# Patient Record
Sex: Female | Born: 1990 | Race: White | Hispanic: No | Smoking: Never smoker
Health system: Southern US, Community
[De-identification: ages and names within clinical notes are randomized; demographics above are authoritative.]

## PROBLEM LIST (undated history)

## (undated) DIAGNOSIS — G4733 Obstructive sleep apnea (adult) (pediatric): Secondary | ICD-10-CM

## (undated) DIAGNOSIS — F32A Depression, unspecified: Secondary | ICD-10-CM

## (undated) DIAGNOSIS — F3181 Bipolar II disorder: Secondary | ICD-10-CM

## (undated) DIAGNOSIS — Z1379 Encounter for other screening for genetic and chromosomal anomalies: Secondary | ICD-10-CM

## (undated) DIAGNOSIS — F419 Anxiety disorder, unspecified: Secondary | ICD-10-CM

## (undated) DIAGNOSIS — F39 Unspecified mood [affective] disorder: Secondary | ICD-10-CM

## (undated) DIAGNOSIS — Z8489 Family history of other specified conditions: Secondary | ICD-10-CM

## (undated) DIAGNOSIS — G43909 Migraine, unspecified, not intractable, without status migrainosus: Secondary | ICD-10-CM

## (undated) DIAGNOSIS — Z803 Family history of malignant neoplasm of breast: Secondary | ICD-10-CM

## (undated) DIAGNOSIS — M419 Scoliosis, unspecified: Secondary | ICD-10-CM

## (undated) HISTORY — PX: LAPAROSCOPIC OOPHERECTOMY: SHX6507

## (undated) HISTORY — DX: Obstructive sleep apnea (adult) (pediatric): G47.33

## (undated) HISTORY — DX: Unspecified mood (affective) disorder: F39

## (undated) HISTORY — DX: Anxiety disorder, unspecified: F41.9

## (undated) HISTORY — DX: Encounter for other screening for genetic and chromosomal anomalies: Z13.79

## (undated) HISTORY — PX: WISDOM TOOTH EXTRACTION: SHX21

## (undated) HISTORY — DX: Migraine, unspecified, not intractable, without status migrainosus: G43.909

## (undated) HISTORY — DX: Family history of malignant neoplasm of breast: Z80.3

---

## 2010-05-25 HISTORY — PX: LAPAROSCOPIC OOPHERECTOMY: SHX6507

## 2012-01-13 ENCOUNTER — Ambulatory Visit: Payer: Self-pay | Admitting: Family Medicine

## 2012-01-13 VITALS — BP 110/72 | HR 80 | Temp 98.5°F | Resp 18 | Ht 69.5 in | Wt 133.0 lb

## 2012-01-13 DIAGNOSIS — L03119 Cellulitis of unspecified part of limb: Secondary | ICD-10-CM

## 2012-01-13 DIAGNOSIS — IMO0002 Reserved for concepts with insufficient information to code with codable children: Secondary | ICD-10-CM

## 2012-01-13 MED ORDER — DOXYCYCLINE HYCLATE 100 MG PO TABS: 100.0000 mg | ORAL_TABLET | Freq: Two times a day (BID) | ORAL | Status: AC

## 2012-01-13 NOTE — Progress Notes (Signed)
[facility] [address], [place] [address] [phone]  Date:  01/13/2012   Name:  Katelyn King   DOB:  03/20/1991   MRN:  [id number]  PCP:  No primary provider on file.    Chief Complaint: infection on left arm   History of Present Illness:  Katelyn King is a 21 y.o. very pleasant female patient who presents with the following:  She notes an area of infection on her left arm.  She ?got a splinter in the area when she was in [place] about 6 weeks ago. She is actually not sure if she had a splinter, but she did seem to have some sort of injury to the skin that got better. The area was ok, but then it started to be red and sore a few days ago. It is now painful and has a lump on the skin.  No fever, no aches.   She is generally healthy.  She is on OCP.  Recently moved to this area with her SO.    There is no problem list on file for this patient.   No past medical history on file.  No past surgical history on file.  History  Substance Use Topics  . Smoking status: Never Smoker   . Smokeless tobacco: Not on file  . Alcohol Use: Not on file    No family history on file.  No Known Allergies  Medication list has been reviewed and updated.  Current Outpatient Prescriptions on File Prior to Visit  Medication Sig Dispense Refill  . drospirenone-ethinyl estradiol (YASMIN,ZARAH,SYEDA) 3-0.03 MG tablet Take 1 tablet by mouth daily.      Marland Kitchen lamoTRIgine (LAMICTAL) 150 MG tablet Take 150 mg by mouth daily.        Review of Systems:  As per HPI- otherwise negative.   Physical Examination: Filed Vitals:   01/13/12 1250  BP: 110/72  Pulse: 80  Temp: 98.5 F (36.9 C)  Resp: 18   Filed Vitals:   01/13/12 1250  Height: 5' 9.5" (1.765 m)  Weight: 133 lb (60.328 kg)   Body mass index is 19.36 kg/(m^2). Ideal Body Weight: Weight in (lb) to have BMI = 25: 171.4    GEN: WDWN, NAD, Non-toxic, Alert & Oriented x 3 HEENT: Atraumatic, Normocephalic.    Ears and Nose: No external deformity. EXTR: No clubbing/cyanosis/edema NEURO: Normal gait.  PSYCH: Normally interactive. Conversant. Not depressed or anxious appearing.  Calm demeanor.  Left ventral forearm: there is a tender, swollen area consistent with a superficial infection.  ? Tip of a splinter visible under skin.  Area cleansed, then nicked top of skin and tried to removed FB with splinter forceps- only a tiny piece of foreign matter removed, no large splinter.    Assessment and Plan: 1. Cellulitis of arm  doxycycline (VIBRA-TABS) 100 MG tablet   Cellulitis of arm, which may or may not be related to a retained splinter.  Hot compresses, doxycycline (watch for OCP interaction).  Call or RTC if not better in 2 days- Sooner if worse.     Abbe Amsterdam, MD

## 2013-06-08 ENCOUNTER — Other Ambulatory Visit (HOSPITAL_COMMUNITY)
Admission: RE | Admit: 2013-06-08 | Discharge: 2013-06-08 | Disposition: A | Discharge status: Home / Self Care | Payer: Self-pay | Source: Ambulatory Visit | Attending: Obstetrics & Gynecology | Admitting: Obstetrics & Gynecology

## 2013-06-08 DIAGNOSIS — Z01419 Encounter for gynecological examination (general) (routine) without abnormal findings: Secondary | ICD-10-CM | POA: Insufficient documentation

## 2013-11-07 ENCOUNTER — Emergency Department (HOSPITAL_COMMUNITY)
Admission: EM | Admit: 2013-11-07 | Discharge: 2013-11-07 | Disposition: A | Discharge status: Home / Self Care | Payer: Self-pay | Attending: Emergency Medicine | Admitting: Emergency Medicine

## 2013-11-07 ENCOUNTER — Encounter (HOSPITAL_COMMUNITY): Payer: Self-pay | Admitting: Emergency Medicine

## 2013-11-07 DIAGNOSIS — Y939 Activity, unspecified: Secondary | ICD-10-CM | POA: Insufficient documentation

## 2013-11-07 DIAGNOSIS — S61209A Unspecified open wound of unspecified finger without damage to nail, initial encounter: Secondary | ICD-10-CM | POA: Insufficient documentation

## 2013-11-07 DIAGNOSIS — S61219A Laceration without foreign body of unspecified finger without damage to nail, initial encounter: Secondary | ICD-10-CM

## 2013-11-07 DIAGNOSIS — W268XXA Contact with other sharp object(s), not elsewhere classified, initial encounter: Secondary | ICD-10-CM | POA: Insufficient documentation

## 2013-11-07 DIAGNOSIS — Z79899 Other long term (current) drug therapy: Secondary | ICD-10-CM | POA: Insufficient documentation

## 2013-11-07 DIAGNOSIS — Y929 Unspecified place or not applicable: Secondary | ICD-10-CM | POA: Insufficient documentation

## 2013-11-07 MED ORDER — IBUPROFEN 800 MG PO TABS: 800.0000 mg | ORAL_TABLET | Freq: Three times a day (TID) | ORAL | Status: DC

## 2013-11-07 MED ORDER — IBUPROFEN 800 MG PO TABS
800.0000 mg | ORAL_TABLET | Freq: Once | ORAL | Status: AC
  Administered 2013-11-07: 800 mg via ORAL
  Filled 2013-11-07: qty 1

## 2013-11-07 NOTE — ED Notes (Signed)
Pt arrived to the D with a complaint of a laceration to the right middle finger.  Pt cut her finger on a scissor.  Laceration is 2cm longs located on the first joint of the right middle finger

## 2013-11-07 NOTE — ED Provider Notes (Signed)
CSN: [id number]     Arrival date & time 11/07/13  0100 History   First MD Initiated Contact with Patient 11/07/13 0425     Chief Complaint  Patient presents with  . Extremity Laceration     (Consider location/radiation/quality/duration/timing/severity/associated sxs/prior Treatment) Patient is a 23 y.o. female presenting with skin laceration. The history is provided by the patient.  Laceration Location:  Finger Finger laceration location:  R middle finger Depth:  Cutaneous Quality comment:  U shaped Bleeding: controlled   Injury mechanism: scissors. Pain details:    Quality:  Aching   Severity:  Severe   Timing:  Constant   Progression:  Unchanged Foreign body present:  No foreign bodies Relieved by:  Nothing Worsened by:  Nothing tried Ineffective treatments:  None tried Tetanus status:  Up to date   History reviewed. No pertinent past medical history. History reviewed. No pertinent past surgical history. History reviewed. No pertinent family history. History  Substance Use Topics  . Smoking status: Never Smoker   . Smokeless tobacco: Not on file  . Alcohol Use: Yes   OB History   Grav Para Term Preterm Abortions TAB SAB Ect Mult Living                 Review of Systems  Skin: Negative for wound.  All other systems reviewed and are negative.     Allergies  Review of patient's allergies indicates no known allergies.  Home Medications   Prior to Admission medications   Medication Sig Start Date End Date Taking? Authorizing Provider  drospirenone-ethinyl estradiol (YASMIN,ZARAH,SYEDA) 3-0.03 MG tablet Take 1 tablet by mouth daily.    Historical Provider, MD  lamoTRIgine (LAMICTAL) 150 MG tablet Take 150 mg by mouth daily.    Historical Provider, MD   BP 121/70  Pulse 94  Temp(Src) 98.3 F (36.8 C) (Oral)  Resp 18  SpO2 100%  LMP 10/31/2013 Physical Exam  Constitutional: She is oriented to person, place, and time. She appears well-developed and  well-nourished. No distress.  HENT:  Head: Normocephalic and atraumatic.  Mouth/Throat: Oropharynx is clear and moist.  Eyes: Conjunctivae are normal. Pupils are equal, round, and reactive to light.  Neck: Normal range of motion. Neck supple.  Cardiovascular: Normal rate, regular rhythm and intact distal pulses.   Pulmonary/Chest: Effort normal and breath sounds normal. She has no wheezes. She has no rales.  Abdominal: Soft. Bowel sounds are normal.  Musculoskeletal: Normal range of motion.  Neurological: She is alert and oriented to person, place, and time.  Skin: Skin is warm and dry.  Psychiatric: She has a normal mood and affect.    ED Course  Procedures (including critical care time) Labs Review Labs Reviewed - No data to display  Imaging Review No results found.   EKG Interpretation None      MDM   Final diagnoses:  None    LACERATION REPAIR Performed by: Carlisle Beers Authorized by: Carlisle Beers Consent: Verbal consent obtained. Risks and benefits: risks, benefits and alternatives were discussed Consent given by: patient Patient identity confirmed: provided demographic data Prepped and Draped in normal sterile fashion Wound explored  Laceration Location: right dorsal distal middle finger  Laceration Length: 1 cm  No Foreign Bodies seen or palpated  Irrigation method: syringe Amount of cleaning: standard  Skin closure: dermabond  Technique: dermabond Patient tolerance: Patient tolerated the procedure well with no immediate complications.     Carlisle Beers, MD 11/07/13 [phone]

## 2015-05-28 ENCOUNTER — Other Ambulatory Visit: Payer: Self-pay | Admitting: Family Medicine

## 2015-05-28 ENCOUNTER — Ambulatory Visit
Admission: RE | Admit: 2015-05-28 | Discharge: 2015-05-28 | Disposition: A | Discharge status: Home / Self Care | Payer: No Typology Code available for payment source | Source: Ambulatory Visit | Attending: Family Medicine | Admitting: Family Medicine

## 2015-05-28 DIAGNOSIS — R519 Headache, unspecified: Secondary | ICD-10-CM

## 2015-05-28 DIAGNOSIS — R51 Headache: Principal | ICD-10-CM

## 2015-06-11 ENCOUNTER — Ambulatory Visit (INDEPENDENT_AMBULATORY_CARE_PROVIDER_SITE_OTHER): Payer: Self-pay | Admitting: Neurology

## 2015-06-11 ENCOUNTER — Encounter: Payer: Self-pay | Admitting: Neurology

## 2015-06-11 VITALS — BP 105/69 | HR 82 | Ht 70.0 in | Wt 150.0 lb

## 2015-06-11 DIAGNOSIS — G43109 Migraine with aura, not intractable, without status migrainosus: Secondary | ICD-10-CM

## 2015-06-11 DIAGNOSIS — G43909 Migraine, unspecified, not intractable, without status migrainosus: Secondary | ICD-10-CM | POA: Insufficient documentation

## 2015-06-11 MED ORDER — RIZATRIPTAN BENZOATE 5 MG PO TBDP: 5.0000 mg | ORAL_TABLET | ORAL | Status: DC | PRN

## 2015-06-11 MED ORDER — PROPRANOLOL HCL ER 60 MG PO CP24: 60.0000 mg | ORAL_CAPSULE | Freq: Every day | ORAL | Status: DC

## 2015-06-11 NOTE — Patient Instructions (Signed)
Magnesium oxide 400 mg twice a day Riboflavin  100 mg twice a day 

## 2015-06-11 NOTE — Progress Notes (Signed)
PATIENT: Katelyn King DOB: [birth year]  Chief Complaint  Patient presents with  . Frequent headaches    She is here with her husband, Will.  Reports having at least 8 headache days per month, in varying degrees of severity. The headaches are sometimes associated with vision changes, nausea and dizziness.  She has never taken a prophylactic medication.  She has never tried any triptans.  Fiorcet and OTC NSAIDS are no longer helpful.     HISTORICAL  Marcenia Meltzer is a 25 years old right-handed female, accompanied by her husband, seen in refer by her primary care physician Dr. Antony Contras for evaluation of chronic migraine  She had a history of bipolar type II, is taking lamotrigine 200 mg every night, Latuda 20 mg every night,  She reported a history of migraine since elementary school, her typical migraine are lateralized severe pounding headache was associated light noise sensitivity, lasting 6-8 hours, she brought headache diary at today's visit, she has average 6 to 8 typical migraines each month, moderate to severe, she has been taking over-the-counter Tylenol, sometimes with caffeinated drink to help her headache, occasionally her migraines preceded by visual distortion.  Trigger for her headache a sleep deprivation, weather change, stress,  Laboratory in 2016, normal glucose 79, LDL 72, normal CBC, hemoglobin 13.8, normal ferritin 70 7.9, TSH 3.79  I have personally reviewed CAT scan of the brain without contrast January 3rd 2016, that was within normal limit   REVIEW OF SYSTEMS: Full 14 system review of systems performed and notable only for headaches, depression, anxiety, running nose  ALLERGIES: No Known Allergies  HOME MEDICATIONS: Current Outpatient Prescriptions  Medication Sig Dispense Refill  . drospirenone-ethinyl estradiol (YASMIN,ZARAH,SYEDA) 3-0.03 MG tablet Take 1 tablet by mouth daily.    Marland Kitchen lamoTRIgine (LAMICTAL) 150 MG tablet Take 150 mg by mouth daily.    Marland Kitchen  lurasidone (LATUDA) 20 MG TABS tablet Take 20 mg by mouth daily.     No current facility-administered medications for this visit.    PAST MEDICAL HISTORY: Past Medical History  Diagnosis Date  . Mood disorder ([place])   . Migraine   . Anxiety     PAST SURGICAL HISTORY: Past Surgical History  Procedure Laterality Date  . Laparoscopic oopherectomy      FAMILY HISTORY: Family History  Problem Relation Age of Onset  . Breast cancer Mother   . Breast cancer Paternal Grandmother   . Hypertension Paternal Grandfather   . Hypercholesterolemia Maternal Grandmother   . Hypercholesterolemia Paternal Grandmother   . Heart disease Maternal Grandfather   . Healthy Father     SOCIAL HISTORY:  Social History   Social History  . Marital Status: Married    Spouse Name: N/A  . Number of Children: 0  . Years of Education: N/A   Occupational History  . Student     Investment banker, corporate   Social History Main Topics  . Smoking status: Never Smoker   . Smokeless tobacco: Not on file  . Alcohol Use: No  . Drug Use: No  . Sexual Activity: Yes   Other Topics Concern  . Not on file   Social History Narrative   Lives at home with husband.   Right-handed.   No caffeine use.     PHYSICAL EXAM   Filed Vitals:   06/11/15 1536  BP: 105/69  Pulse: 82  Height: 5\' 10"  (1.778 m)  Weight: 150 lb (68.04 kg)    Not recorded  Body mass index is 21.52 kg/(m^2).  PHYSICAL EXAMNIATION:  Gen: NAD, conversant, well nourised, obese, well groomed                     Cardiovascular: Regular rate rhythm, no peripheral edema, warm, nontender. Eyes: Conjunctivae clear without exudates or hemorrhage Neck: Supple, no carotid bruise. Pulmonary: Clear to auscultation bilaterally   NEUROLOGICAL EXAM:  MENTAL STATUS: Speech:    Speech is normal; fluent and spontaneous with normal comprehension.  Cognition:     Orientation to time, place and person     Normal recent and remote  memory     Normal Attention span and concentration     Normal Language, naming, repeating,spontaneous speech     Fund of knowledge   CRANIAL NERVES: CN II: Visual fields are full to confrontation. Fundoscopic exam is normal with sharp discs and no vascular changes. Pupils are round equal and briskly reactive to light. CN III, IV, VI: extraocular movement are normal. No ptosis. CN V: Facial sensation is intact to pinprick in all 3 divisions bilaterally. Corneal responses are intact.  CN VII: Face is symmetric with normal eye closure and smile. CN VIII: Hearing is normal to rubbing fingers CN IX, X: Palate elevates symmetrically. Phonation is normal. CN XI: Head turning and shoulder shrug are intact CN XII: Tongue is midline with normal movements and no atrophy.  MOTOR: There is no pronator drift of out-stretched arms. Muscle bulk and tone are normal. Muscle strength is normal.  REFLEXES: Reflexes are 2+ and symmetric at the biceps, triceps, knees, and ankles. Plantar responses are flexor.  SENSORY: Intact to light touch, pinprick, position sense, and vibration sense are intact in fingers and toes.  COORDINATION: Rapid alternating movements and fine finger movements are intact. There is no dysmetria on finger-to-nose and heel-knee-shin.    GAIT/STANCE: Posture is normal. Gait is steady with normal steps, base, arm swing, and turning. Heel and toe walking are normal. Tandem gait is normal.  Romberg is absent.   DIAGNOSTIC DATA (LABS, IMAGING, TESTING) - I reviewed patient records, labs, notes, testing and imaging myself where available.   ASSESSMENT AND PLAN  Tagen Grzesik is a 25 y.o. female   Chronic migraines  Start preventive medications Inderal XL 60 mg daily  Maxalt as needed   Marcial Pacas, M.D. Ph.D.  [facility] [address] [place], [place] [address] Ph: [phone] Fax: [phone]  CC: Antony Contras, MD

## 2015-08-15 ENCOUNTER — Ambulatory Visit (INDEPENDENT_AMBULATORY_CARE_PROVIDER_SITE_OTHER): Payer: Self-pay | Admitting: Nurse Practitioner

## 2015-08-15 ENCOUNTER — Encounter: Payer: Self-pay | Admitting: Nurse Practitioner

## 2015-08-15 VITALS — BP 99/68 | HR 73 | Ht 70.0 in | Wt 148.8 lb

## 2015-08-15 DIAGNOSIS — G43109 Migraine with aura, not intractable, without status migrainosus: Secondary | ICD-10-CM

## 2015-08-15 NOTE — Progress Notes (Signed)
[facility]  PATIENT: Katelyn King DOB: 1990/11/30   REASON FOR VISIT: Follow-up for migraine HISTORY FROM: Patient    HISTORY OF PRESENT ILLNESS: Ms. Daigneau, 25 year old female returns for follow-up. She was initially evaluated for migraine by Dr. Army Melia 06/11/2015. She was placed on Inderal LA 60 mg at that time. She has kept a record of her headaches. She has had 3 headaches that were 5 out of 10 on the pain scale and  3 that were 3 out of 10 on a pain scale. She has not had a headache since 07/19/15 and she states the "medication has changed my life ". She also has a history of bipolar disorder. She takes melatonin for  insomnia with relief. She returns for reevaluation  HISTORY: Devonya Nees is a 25 years old right-handed female, accompanied by her husband, seen in refer by her primary care physician Dr. Antony Contras for evaluation of chronic migraine  She had a history of bipolar type II, is taking lamotrigine 200 mg every night, Latuda 20 mg every night, She reported a history of migraine since elementary school, her typical migraine are lateralized severe pounding headache was associated light noise sensitivity, lasting 6-8 hours, she brought headache diary at today's visit, she has average 6 to 8 typical migraines each month, moderate to severe, she has been taking over-the-counter Tylenol, sometimes with caffeinated drink to help her headache, occasionally her migraines preceded by visual distortion. Trigger for her headache a sleep deprivation, weather change, stress, Laboratory in 2016, normal glucose 79, LDL 72, normal CBC, hemoglobin 13.8, normal ferritin 70 7.9, TSH 3.79 I have personally reviewed CAT scan of the brain without contrast January 3rd 2016, that was within normal limit    REVIEW OF SYSTEMS: Full 14 system review of systems performed and notable only for those listed, all others are neg:  Constitutional: neg  Cardiovascular: neg Ear/Nose/Throat:  neg  Skin: neg Eyes: neg Respiratory: neg Gastroitestinal: neg  Hematology/Lymphatic: neg  Endocrine: neg Musculoskeletal:neg Allergy/Immunology: neg Neurological: neg Psychiatric: Depression treated by psychiatry Sleep : neg   ALLERGIES: No Known Allergies  HOME MEDICATIONS: Outpatient Prescriptions Prior to Visit  Medication Sig Dispense Refill  . drospirenone-ethinyl estradiol (YASMIN,ZARAH,SYEDA) 3-0.03 MG tablet Take 1 tablet by mouth daily.    . propranolol ER (INDERAL LA) 60 MG 24 hr capsule Take 1 capsule (60 mg total) by mouth daily. 30 capsule 6  . rizatriptan (MAXALT-MLT) 5 MG disintegrating tablet Take 1 tablet (5 mg total) by mouth as needed. May repeat in 2 hours if needed 15 tablet 6  . lamoTRIgine (LAMICTAL) 150 MG tablet Take 150 mg by mouth daily.    Marland Kitchen lurasidone (LATUDA) 20 MG TABS tablet Take 20 mg by mouth daily.     No facility-administered medications prior to visit.    PAST MEDICAL HISTORY: Past Medical History  Diagnosis Date  . Mood disorder ([place])   . Migraine   . Anxiety     PAST SURGICAL HISTORY: Past Surgical History  Procedure Laterality Date  . Laparoscopic oopherectomy      FAMILY HISTORY: Family History  Problem Relation Age of Onset  . Breast cancer Mother   . Breast cancer Paternal Grandmother   . Hypertension Paternal Grandfather   . Hypercholesterolemia Maternal Grandmother   . Hypercholesterolemia Paternal Grandmother   . Heart disease Maternal Grandfather   . Healthy Father     SOCIAL HISTORY: Social History   Social History  . Marital Status: Married  Spouse Name: N/A  . Number of Children: 0  . Years of Education: N/A   Occupational History  . Student     Investment banker, corporate   Social History Main Topics  . Smoking status: Never Smoker   . Smokeless tobacco: Never Used  . Alcohol Use: No  . Drug Use: No  . Sexual Activity:    Partners: Male   Other Topics Concern  . Not on file   Social History  Narrative   Lives at home with husband.   Right-handed.   No caffeine use.     PHYSICAL EXAM  Filed Vitals:   08/15/15 1634  BP: 99/68  Pulse: 73  Height: 5\' 10"  (1.778 m)  Weight: 148 lb 12.8 oz (67.495 kg)   Body mass index is 21.35 kg/(m^2).  Generalized: Well developed, in no acute distress  Head: normocephalic and atraumatic,. Oropharynx benign  Neck: Supple, no carotid bruits  Cardiac: Regular rate rhythm, no murmur  Musculoskeletal: No deformity   Neurological examination   Mentation: Alert oriented to time, place, history taking. Attention span and concentration appropriate. Recent and remote memory intact.  Follows all commands speech and language fluent.   Cranial nerve II-XII: Pupils were equal round reactive to light extraocular movements were full, visual field were full on confrontational test. Facial sensation and strength were normal. hearing was intact to finger rubbing bilaterally. Uvula tongue midline. head turning and shoulder shrug were normal and symmetric.Tongue protrusion into cheek strength was normal. Motor: normal bulk and tone, full strength in the BUE, BLE, fine finger movements normal, no pronator drift. No focal weakness Sensory: normal and symmetric to light touch, pinprick, and  Vibration, proprioception  Coordination: finger-nose-finger, heel-to-shin bilaterally, no dysmetria Reflexes: Brachioradialis 2/2, biceps 2/2, triceps 2/2, patellar 2/2, Achilles 2/2, plantar responses were flexor bilaterally. Gait and Station: Rising up from seated position without assistance, normal stance,  moderate stride, good arm swing, smooth turning, able to perform tiptoe, and heel walking without difficulty. Tandem gait is steady  DIAGNOSTIC DATA (LABS, IMAGING, TESTING) -ASSESSMENT AND PLAN  25 y.o. year old female  has a past medical history of Mood disorder ([place]); Migraine; and Anxiety. here to follow-up for migraines. Her migraines are in excellent control  since being placed on Inderal LA 60 mg daily. She has not had a migraine in over a month  Continue Inderal at 60mg  daily Continue Maxalt acutely  F/U in 6 months  Dennie Bible, [facility], [facility], [facility] [address] [place], [place] [address] [phone]

## 2015-08-15 NOTE — Patient Instructions (Signed)
Continue Inderal at 60mg  daily Continue Maxalt acutely  F/U in 6 months

## 2015-08-16 NOTE — Progress Notes (Signed)
I have reviewed and agreed above plan. 

## 2015-12-29 ENCOUNTER — Other Ambulatory Visit: Payer: Self-pay | Admitting: Neurology

## 2016-02-14 ENCOUNTER — Encounter: Payer: Self-pay | Admitting: Nurse Practitioner

## 2016-02-14 ENCOUNTER — Ambulatory Visit (INDEPENDENT_AMBULATORY_CARE_PROVIDER_SITE_OTHER): Payer: Self-pay | Admitting: Nurse Practitioner

## 2016-02-14 VITALS — BP 100/68 | HR 72 | Ht 70.0 in | Wt 143.8 lb

## 2016-02-14 DIAGNOSIS — G43109 Migraine with aura, not intractable, without status migrainosus: Secondary | ICD-10-CM

## 2016-02-14 MED ORDER — PROPRANOLOL HCL ER 60 MG PO CP24: ORAL_CAPSULE | ORAL | 11 refills | Status: DC

## 2016-02-14 MED ORDER — RIZATRIPTAN BENZOATE 5 MG PO TBDP
5.0000 mg | ORAL_TABLET | ORAL | 6 refills | Status: DC | PRN
Start: 2016-02-14 — End: 2017-03-04

## 2016-02-14 NOTE — Progress Notes (Signed)
[facility]  PATIENT: Katelyn King DOB: 27-May-1990   REASON FOR VISIT: Follow-up for migraine HISTORY FROM: Patient    HISTORY OF PRESENT ILLNESS: Katelyn King, 25 year old female returns for follow-up. She was initially evaluated for migraine by Dr. Krista Blue 06/11/2015. She was placed on Inderal LA 60 mg at that time. She has not kept a record of her headaches because they are less but she has about 2 headaches per usually around her menstrual cycle. She states the "medication has changed my life ". She also has a history of bipolar disorder. She takes melatonin for  insomnia with relief.  She fell in her bathroom back in January 2017 and hit her head. CT of the brain was normal.She returns for reevaluation  HISTORY: Katelyn King is a 25 years old right-handed female, accompanied by her husband, seen in refer by her primary care physician Dr. Antony Contras for evaluation of chronic migraine  She had a history of bipolar type II, is taking lamotrigine 200 mg every night, Latuda 20 mg every night, She reported a history of migraine since elementary school, her typical migraine are lateralized severe pounding headache was associated light noise sensitivity, lasting 6-8 hours, she brought headache diary at today's visit, she has average 6 to 8 typical migraines each month, moderate to severe, she has been taking over-the-counter Tylenol, sometimes with caffeinated drink to help her headache, occasionally her migraines preceded by visual distortion. Trigger for her headache a sleep deprivation, weather change, stress, Laboratory in 2016, normal glucose 79, LDL 72, normal CBC, hemoglobin 13.8, normal ferritin 70 7.9, TSH 3.79 I have personally reviewed CAT scan of the brain without contrast January 3rd 2016, that was within normal limit    REVIEW OF SYSTEMS: Full 14 system review of systems performed and notable only for those listed, all others are neg:  Constitutional: neg    Cardiovascular: neg Ear/Nose/Throat: neg  Skin: neg Eyes: neg Respiratory: neg Gastroitestinal: neg  Hematology/Lymphatic: neg  Endocrine: neg Musculoskeletal:neg Allergy/Immunology: neg Neurological: neg Psychiatric: Depression treated by psychiatry Sleep : neg   ALLERGIES: No Known Allergies  HOME MEDICATIONS: Outpatient Medications Prior to Visit  Medication Sig Dispense Refill  . drospirenone-ethinyl estradiol (YASMIN,ZARAH,SYEDA) 3-0.03 MG tablet Take 1 tablet by mouth daily.    Marland Kitchen lamoTRIgine (LAMICTAL) 200 MG tablet Take 200 mg by mouth daily.    . Lurasidone HCl (LATUDA) 60 MG TABS Take 30 mg by mouth daily.    Marland Kitchen MELATONIN PO Take 3 mg by mouth daily.    . propranolol ER (INDERAL LA) 60 MG 24 hr capsule TAKE 1 CAPSULE(60 MG) BY MOUTH DAILY 30 capsule 11  . rizatriptan (MAXALT-MLT) 5 MG disintegrating tablet Take 1 tablet (5 mg total) by mouth as needed. May repeat in 2 hours if needed 15 tablet 6   No facility-administered medications prior to visit.     PAST MEDICAL HISTORY: Past Medical History:  Diagnosis Date  . Anxiety   . Migraine   . Mood disorder ([place])     PAST SURGICAL HISTORY: Past Surgical History:  Procedure Laterality Date  . LAPAROSCOPIC OOPHERECTOMY      FAMILY HISTORY: Family History  Problem Relation Age of Onset  . Breast cancer Mother   . Healthy Father   . Breast cancer Paternal Grandmother   . Hypercholesterolemia Paternal Grandmother   . Hypertension Paternal Grandfather   . Hypercholesterolemia Maternal Grandmother   . Heart disease Maternal Grandfather     SOCIAL HISTORY: Social History  Social History  . Marital status: Married    Spouse name: N/A  . Number of children: 0  . Years of education: N/A   Occupational History  . Student     Investment banker, corporate   Social History Main Topics  . Smoking status: Never Smoker  . Smokeless tobacco: Never Used  . Alcohol use No  . Drug use: No  . Sexual activity: Yes     Partners: Male   Other Topics Concern  . Not on file   Social History Narrative   Lives at home with husband.   Right-handed.   No caffeine use.     PHYSICAL EXAM  Vitals:   02/14/16 1028  BP: 100/68  Pulse: 72  Weight: 143 lb 12.8 oz (65.2 kg)  Height: 5\' 10"  (1.778 m)   Body mass index is 20.63 kg/m.  Generalized: Well developed, in no acute distress  Head: normocephalic and atraumatic,. Oropharynx benign  Neck: Supple, no carotid bruits  Cardiac: Regular rate rhythm, no murmur  Musculoskeletal: No deformity   Neurological examination   Mentation: Alert oriented to time, place, history taking. Attention span and concentration appropriate. Recent and remote memory intact.  Follows all commands speech and language fluent.   Cranial nerve II-XII: Pupils were equal round reactive to light extraocular movements were full, visual field were full on confrontational test. Facial sensation and strength were normal. hearing was intact to finger rubbing bilaterally. Uvula tongue midline. head turning and shoulder shrug were normal and symmetric.Tongue protrusion into cheek strength was normal. Motor: normal bulk and tone, full strength in the BUE, BLE, fine finger movements normal, no pronator drift. No focal weakness Sensory: normal and symmetric to light touch, pinprick, and  Vibration, proprioception  Coordination: finger-nose-finger, heel-to-shin bilaterally, no dysmetria Reflexes: Brachioradialis 2/2, biceps 2/2, triceps 2/2, patellar 2/2, Achilles 2/2, plantar responses were flexor bilaterally. Gait and Station: Rising up from seated position without assistance, normal stance,  moderate stride, good arm swing, smooth turning, able to perform tiptoe, and heel walking without difficulty. Tandem gait is steady  DIAGNOSTIC DATA (LABS, IMAGING, TESTING) -ASSESSMENT AND PLAN  25 y.o. year old female  has a past medical history of Anxiety; Migraine; and Mood disorder ([place]). here to  follow-up for migraines. Her migraines are in excellent control since being placed on Inderal LA 60 mg daily. She has 1 to 2 headaches  per month usually around her cycle.  Continue Inderal at 60mg  daily Continue Maxalt acutely  Call for worsening of headaches F/U in 6  To 8 months  Dennie Bible, [facility], [facility], APRN  [facility] [address] [place], [place] [address] [phone]

## 2016-02-14 NOTE — Patient Instructions (Signed)
Continue Inderal at 60mg  daily Continue Maxalt acutely  Call for worsening of headaches Follow-up in 6-8 months

## 2016-02-19 NOTE — Progress Notes (Signed)
I have reviewed and agreed above plan. 

## 2016-05-25 HISTORY — PX: WISDOM TOOTH EXTRACTION: SHX21

## 2016-06-26 ENCOUNTER — Other Ambulatory Visit (HOSPITAL_COMMUNITY)
Admission: RE | Admit: 2016-06-26 | Discharge: 2016-06-26 | Disposition: A | Discharge status: Home / Self Care | Payer: 59 | Source: Ambulatory Visit | Attending: Obstetrics and Gynecology | Admitting: Obstetrics and Gynecology

## 2016-06-26 ENCOUNTER — Other Ambulatory Visit: Payer: Self-pay | Admitting: Obstetrics & Gynecology

## 2016-06-26 DIAGNOSIS — Z01419 Encounter for gynecological examination (general) (routine) without abnormal findings: Secondary | ICD-10-CM | POA: Diagnosis not present

## 2016-06-30 DIAGNOSIS — E559 Vitamin D deficiency, unspecified: Secondary | ICD-10-CM | POA: Diagnosis not present

## 2016-06-30 DIAGNOSIS — R5383 Other fatigue: Secondary | ICD-10-CM | POA: Diagnosis not present

## 2016-06-30 LAB — CYTOLOGY - PAP: Diagnosis: NEGATIVE

## 2016-07-06 ENCOUNTER — Encounter: Payer: Self-pay | Admitting: Physical Therapy

## 2016-07-06 ENCOUNTER — Ambulatory Visit: Payer: 59 | Attending: Obstetrics & Gynecology | Admitting: Physical Therapy

## 2016-07-06 DIAGNOSIS — M62838 Other muscle spasm: Secondary | ICD-10-CM | POA: Insufficient documentation

## 2016-07-06 DIAGNOSIS — M6281 Muscle weakness (generalized): Secondary | ICD-10-CM | POA: Diagnosis present

## 2016-07-06 NOTE — Therapy (Signed)
[facility] [address] [place], [place], [address] Phone: [phone]   Fax:  [phone]  Physical Therapy Evaluation  Patient Details  Name: Katelyn King MRN: [id number] Date of Birth: 1990-06-10 Referring Provider: Dr. Janyth Pupa  Encounter Date: 07/06/2016      PT End of Session - 07/06/16 1027    Visit Number 1   Date for PT Re-Evaluation 11/03/16   PT Start Time 0930   PT Stop Time 1015   PT Time Calculation (min) 45 min   Activity Tolerance Patient tolerated treatment well   Behavior During Therapy [facility] for tasks assessed/performed      Past Medical History:  Diagnosis Date  . Anxiety   . Migraine   . Mood disorder [facility])     Past Surgical History:  Procedure Laterality Date  . LAPAROSCOPIC OOPHERECTOMY      There were no vitals filed for this visit.       Subjective Assessment - 07/06/16 0939    Subjective Patient reports pain with sex for 9 years.  Patient reports her husband is large and she has trouble to relax. When use a vibrator to relax able to orgasm.    Patient Stated Goals decreased pain with sex   Currently in Pain? Yes   Pain Score 10-Worst pain ever  can be a 5/10   Pain Location Vagina   Pain Orientation Mid   Pain Descriptors / Indicators Stabbing;Sharp   Pain Type Chronic pain   Pain Onset More than a month ago   Pain Frequency Intermittent   Aggravating Factors  intercourse; vaginal exam;    Pain Relieving Factors no intercourse   Multiple Pain Sites No            [facility] PT Assessment - 07/06/16 0001      Assessment   Medical Diagnosis N94.10 Unspecified dysparenina   Referring Provider Dr. Janyth Pupa   Onset Date/Surgical Date 05/25/05   Prior Therapy NOne     Precautions   Precautions None     Restrictions   Weight Bearing Restrictions No     Balance Screen   Has the patient fallen in the past 6 months No   Has the patient had a decrease in  activity level because of a fear of falling?  No   Is the patient reluctant to leave their home because of a fear of falling?  No     Home Ecologist residence     Prior Function   Level of Independence Independent   Vocation Full time employment   Vocation Requirements sitting   Leisure walks     Cognition   Overall Cognitive Status Within Functional Limits for tasks assessed     Observation/Other Assessments   Focus on Therapeutic Outcomes (FOTO)  17% limitation     Posture/Postural Control   Posture/Postural Control Postural limitations   Postural Limitations Forward head;Rounded Shoulders     ROM / Strength   AROM / PROM / Strength Strength     Strength   Overall Strength Comments bil. hip strength 4/5     Flexibility   Soft Tissue Assessment /Muscle Length yes  hip adductors are tight   Hamstrings tight bil.    Quadriceps tight bil.    Obturator Internus tight bil.      Palpation   Palpation comment tightness in left lower abdominal; lower abdominal scar is tight; bil. hip adductors are tight;  Pelvic Floor Special Questions - 07/06/16 0001    Are you Pregnant or attempting pregnancy? No   Prior Pregnancies No   Currently Sexually Active Yes   Marinoff Scale pain prevents any attempts at intercourse   Urinary urgency Yes  frequent urination   Urinary frequency every 3-4 hours   Caffeine beverages none   Skin Integrity Intact   Perineal Body/Introitus  Elevated   Prolapse None   Pelvic Floor Internal Exam Patient confirms identification and approves PT to assess muscle integrity   Exam Type Vaginal   Palpation tightness in the perineal body and left pelvic floor muscles, bil. side of urethra   Tone increased                  PT Education - 07/06/16 1028    Education provided Yes   Education Details vaginal massage; information on ordering a dilator; diaphgramatic breathing   Person(s)  Educated Patient   Methods Explanation;Demonstration;Handout   Comprehension Verbalized understanding;Returned demonstration          PT Short Term Goals - 07/06/16 1042      PT SHORT TERM GOAL #1   Title independent with Initial HEP   Time 4   Period Weeks   Status New     PT SHORT TERM GOAL #2   Title understand how to use a dilator to expand the vaginal canal   Time 4   Period Weeks   Status New     PT SHORT TERM GOAL #3   Title ability to relax the pelvic floor using mediatation   Time 4   Period Weeks   Status New     PT SHORT TERM GOAL #4   Title independent with self perineal massage   Time 4   Period Weeks   Status New     PT SHORT TERM GOAL #5   Title Marinoff score 2/3   Time 4   Period Weeks   Status New           PT Long Term Goals - 07/06/16 1044      PT LONG TERM GOAL #1   Title independent with HEP   Time 4   Period Months   Status New     PT LONG TERM GOAL #2   Title Marinoff score is 1/3 or less due to reduction in pain with penile penetration   Time 4   Period Months   Status New     PT LONG TERM GOAL #3   Title pain with intercourse decreased >/= 75% due to ability to relax pelvic floor    Time 4   Period Months   Status New     PT LONG TERM GOAL #4   Title on the largest dilator due to expansion of the vaginal introitus   Time 4   Period Months   Status New               Plan - 07/06/16 1034    Clinical Impression Statement Patient is a 26 year old female with diagnosis of dyspareunia for the past 9 years.  Patient has pain with vaginal exam.  Pain level is 10/10 with intercourse. Marinoff score is 3/3 and patient reports she is unable to relax.  Patient can [place] vibration to the vaginal tissue to insert a vibrator due to relaxation of tissue.  Palpable tenderness on the left puborectalis, iliococcygeus, bilateral sides of urethra. Tightness located on right lower abdominal and along  the suprapubic scar. Patient is low  complexity evaluation due to a stable condition and no comorbidities that will impact care provided.  Patient will benefit from skilled therapy to relax the pelvic floor tissue for penile penetration and vaginal exam.    Rehab Potential Excellent   Clinical Impairments Affecting Rehab Potential None   PT Frequency 1x / week   PT Duration Other (comment)  4 months   PT Treatment/Interventions Biofeedback;Cryotherapy;Electrical Stimulation;Ultrasound;Moist Heat;Therapeutic activities;Therapeutic exercise;Neuromuscular re-education;Patient/family education;Passive range of motion;Scar mobilization;Manual techniques;Dry needling;Energy conservation   PT Next Visit Plan hip stretches; soft tissue work to lower abdomen and left side of pelvic floor; bulging of perineum; assess HEP for vaginal massage   PT Home Exercise Plan hip stretches; pelvic floor meditation   Recommended Other Services None   Consulted and Agree with Plan of Care Patient      Patient will benefit from skilled therapeutic intervention in order to improve the following deficits and impairments:  Decreased activity tolerance, Decreased strength, Decreased mobility, Decreased scar mobility, Impaired flexibility, Pain, Increased fascial restricitons, Increased muscle spasms (education on use of dilator)  Visit Diagnosis: Other muscle spasm - Plan: PT plan of care cert/re-cert  Muscle weakness (generalized) - Plan: PT plan of care cert/re-cert     Problem List Patient Active Problem List   Diagnosis Date Noted  . Migraine 06/11/2015    Earlie Counts, PT 07/06/16 11:14 AM   [facility] [address], [place], [place], [address] Phone: [phone]   Fax:  [phone]  Name: Sharrion Galyan MRN: [id number] Date of Birth: December 27, 1990

## 2016-07-06 NOTE — Patient Instructions (Addendum)
STRETCHING THE PELVIC FLOOR MUSCLES NO DILATOR  Supplies . Vaginal lubricant . Mirror (optional) . Gloves (optional) Positioning . Start in a semi-reclined position with your head propped up. Bend your knees and place your thumb or finger at the vaginal opening. Procedure . Apply a moderate amount of lubricant on the outer skin of your vagina, the labia minora.  Apply additional lubricant to your finger. Marland Kitchen Spread the skin away from the vaginal opening. Place the end of your finger at the opening. . Do a maximum contraction of the pelvic floor muscles. Tighten the vagina and the anus maximally and relax. . When you know they are relaxed, gently and slowly insert your finger into your vagina, directing your finger slightly downward, for 2-3 inches of insertion. . Relax and stretch the 6 o'clock position . Hold each stretch for _2 min__ and repeat __1_ time with rest breaks of _1__ seconds between each stretch. . Repeat the stretching in the 4 o'clock and 8 o'clock positions. . Total time should be _6__ minutes, _1__ x per day.  Note the amount of theme your were able to achieve and your tolerance to your finger in your vagina. . Once you have accomplished the techniques you may try them in standing with one foot resting on the tub, or in other positions.  This is a good stretch to do in the shower if you don't need to use lubricant.   Sitting    Sit comfortably. Allow body's muscles to relax. Place hands on belly. Inhale slowly and deeply for 3___ seconds, so hands move out. Then take _3__ seconds to exhale. Repeat __5_ times. Do __2_ times a day.  Copyright  VHI. All rights reserved.  Hook-Lying    Lie with hips and knees bent. Allow body's muscles to relax. Place hands on belly. Inhale slowly and deeply for _3__ seconds, so hands move up. Then take _3__ seconds to exhale. Repeat _5__ times. Do _2__ times a day.   Copyright  VHI. All rights reserved.  Vaginismus.[facility] [address] [place], [place] [address] Phone # [phone] Fax [phone]

## 2016-07-10 DIAGNOSIS — D235 Other benign neoplasm of skin of trunk: Secondary | ICD-10-CM | POA: Diagnosis not present

## 2016-07-10 DIAGNOSIS — L814 Other melanin hyperpigmentation: Secondary | ICD-10-CM | POA: Diagnosis not present

## 2016-07-13 ENCOUNTER — Encounter: Payer: Self-pay | Admitting: Physical Therapy

## 2016-07-24 ENCOUNTER — Ambulatory Visit: Payer: 59 | Attending: Obstetrics & Gynecology | Admitting: Physical Therapy

## 2016-07-24 ENCOUNTER — Encounter: Payer: Self-pay | Admitting: Physical Therapy

## 2016-07-24 DIAGNOSIS — M6281 Muscle weakness (generalized): Secondary | ICD-10-CM | POA: Diagnosis not present

## 2016-07-24 DIAGNOSIS — M62838 Other muscle spasm: Secondary | ICD-10-CM | POA: Diagnosis present

## 2016-07-24 NOTE — Therapy (Signed)
[facility] [address], [place], [place], [address] Phone: [phone]   Fax:  [phone]  Physical Therapy Treatment  Patient Details  Name: Katelyn King MRN: [id number] Date of Birth: 1991-02-19 Referring Provider: Dr. Janyth Pupa  Encounter Date: 07/24/2016      PT End of Session - 07/24/16 0936    Visit Number 2   Date for PT Re-Evaluation 11/03/16   PT Start Time 0932   PT Stop Time 1010   PT Time Calculation (min) 38 min   Activity Tolerance Patient tolerated treatment well   Behavior During Therapy [facility] for tasks assessed/performed      Past Medical History:  Diagnosis Date  . Anxiety   . Migraine   . Mood disorder [facility])     Past Surgical History:  Procedure Laterality Date  . LAPAROSCOPIC OOPHERECTOMY      There were no vitals filed for this visit.      Subjective Assessment - 07/24/16 0938    Subjective After therapy I had my cycle for 2 weeks.  I started my soft tissue work the last week. I have not had intercourse. I am getting the IUD in 3 weeks. I ordered the dilator and will get it  this week.    Patient Stated Goals decreased pain with sex   Currently in Pain? Yes   Pain Score 10-Worst pain ever   Pain Location Vagina   Pain Orientation Mid   Pain Descriptors / Indicators Stabbing;Sharp   Pain Type Chronic pain   Pain Onset More than a month ago   Pain Frequency Intermittent   Aggravating Factors  intercourse; vaginal exam   Pain Relieving Factors no intercourse   Multiple Pain Sites No                      Pelvic Floor Special Questions - 07/24/16 0001    Pelvic Floor Internal Exam Patient confirms identification and approves PT to assess muscle integrity   Exam Type Vaginal           [facility] Adult PT Treatment/Exercise - 07/24/16 0001      Self-Care   Self-Care Other Self-Care Comments   Other Self-Care Comments  education on patient on how to use  dilators and pelvic floor guided meditation to relax pelvic floor     Manual Therapy   Manual Therapy Internal Pelvic Floor   Internal Pelvic Floor left side  of pelvic floor with myofascial release x 23 min                PT Education - 07/24/16 1011    Education provided Yes   Education Details how to use a dilator; pelvic floor meditation   Person(s) Educated Patient   Methods Explanation;Demonstration;Handout   Comprehension Returned demonstration;Verbalized understanding          PT Short Term Goals - 07/24/16 1003      PT SHORT TERM GOAL #1   Title independent with Initial HEP   Time 4   Period Weeks   Status Achieved     PT SHORT TERM GOAL #2   Title understand how to use a dilator to expand the vaginal canal   Time 4   Period Weeks     PT SHORT TERM GOAL #3   Title ability to relax the pelvic floor using mediatation   Time 4   Period Weeks     PT SHORT TERM GOAL #4  Title independent with self perineal massage   Time 4   Period Weeks   Status Achieved     PT SHORT TERM GOAL #5   Title Marinoff score 2/3   Time 4   Period Weeks   Status New           PT Long Term Goals - 07/06/16 1044      PT LONG TERM GOAL #1   Title independent with HEP   Time 4   Period Months   Status New     PT LONG TERM GOAL #2   Title Marinoff score is 1/3 or less due to reduction in pain with penile penetration   Time 4   Period Months   Status New     PT LONG TERM GOAL #3   Title pain with intercourse decreased >/= 75% due to ability to relax pelvic floor    Time 4   Period Months   Status New     PT LONG TERM GOAL #4   Title on the largest dilator due to expansion of the vaginal introitus   Time 4   Period Months   Status New               Plan - 07/24/16 [id number]    Clinical Impression Statement Patient had tenderness located on left side of pelvic floor and after therapy the muscles were able to release.  Patient understands how to use  dilator to tissue expansion and perineal massage.  Patient has not had intercourse yet.  Patient understands how to perform pelvic floor meditation.  Patient will benefit from skilled therapy to reduce pain and relax pelvic floor muscles.    Rehab Potential Excellent   Clinical Impairments Affecting Rehab Potential None   PT Frequency 1x / week   PT Duration Other (comment)  4 months   PT Treatment/Interventions Biofeedback;Cryotherapy;Electrical Stimulation;Ultrasound;Moist Heat;Therapeutic activities;Therapeutic exercise;Neuromuscular re-education;Patient/family education;Passive range of motion;Scar mobilization;Manual techniques;Dry needling;Energy conservation   PT Next Visit Plan hip stretches; soft tissue work to lower abdomen and left side of pelvic floor; bulging of perineum;    PT Home Exercise Plan hip stretches;   Consulted and Agree with Plan of Care Patient      Patient will benefit from skilled therapeutic intervention in order to improve the following deficits and impairments:  Decreased activity tolerance, Decreased strength, Decreased mobility, Decreased scar mobility, Impaired flexibility, Pain, Increased fascial restricitons, Increased muscle spasms (education on use of dilator)  Visit Diagnosis: Other muscle spasm  Muscle weakness (generalized)     Problem List Patient Active Problem List   Diagnosis Date Noted  . Migraine 06/11/2015    Earlie Counts, PT 07/24/16 10:16 AM   [facility] [address] [place], [place], [address] Phone: [phone]   Fax:  [phone]  Name: Katelyn King MRN: [id number] Date of Birth: 08/24/1990

## 2016-07-24 NOTE — Patient Instructions (Addendum)
PROTOCOL FOR DILATORS   1. Wash dilator with soap and water prior to insertion.    2. Lay on your back reclined. Knees are to be up and apart while on your bed or in the bathtub with warm water.   3. Lubricate the end of the dilator with a water-soluble lubricant.  4. Separate the labia.   5. Tense the pelvic floor muscles than relax; while relaxing, slide lubricated dilator into the vagina.    6. Tense muscles again while holding the dilator so it does not get pushed out; relax and slide it in a little further.   7. Try blowing out as if filling a balloon; this may relax the muscles and allow penetration.  Repeat blowing out to insert dilator further.  8. Keep dilator in for 10 minutes if tolerate, with the pelvic floor muscles relaxed to further stretch the canal.   You tube- guided pelvic meditation by [facility] [address], [place], [place] [address] Phone # [phone] Fax [phone]   9. Never force the dilator into the canal.  10. 1-2 times per day

## 2016-07-31 ENCOUNTER — Ambulatory Visit: Payer: 59 | Admitting: Physical Therapy

## 2016-07-31 ENCOUNTER — Encounter: Payer: Self-pay | Admitting: Physical Therapy

## 2016-07-31 DIAGNOSIS — M6281 Muscle weakness (generalized): Secondary | ICD-10-CM

## 2016-07-31 DIAGNOSIS — M62838 Other muscle spasm: Secondary | ICD-10-CM | POA: Diagnosis not present

## 2016-07-31 NOTE — Patient Instructions (Addendum)
   1. Position yourself as shown, grabbing onto the feet or behind the knees; you should feel a gentle stretch.  2. Breathe in and allow the pelvic floor muscles to relax.  3. Hold this position for 2-3 minutes.    While Lying on your back,  hold your knees and gently pull them up towards your chest. Hold 30 sec.  2 times.     In hands and knees with ankles and the feet and knees positioned hip width apart, slowly sink backward towards the heels attempting to touch the buttocks to the heels.  Drop the head and neck and allow the chest to rest on the knees, and the hands maintaining their contact on the floor.  Then you can separate your knees and lay on 2-3 pillow for 2 min.  Restorative Pose: Relaxed Center Pose (Blocks, Support)    Arrange two folded blankets to support spine and head, blocks to support knees. Remain in position for _2___ minutes.  Copyright  VHI. All rights reserved.    Start: Cross one leg over the other as pictured, reach with one hand through the opening your legs and the other outside of that opening to the back of the knee as pictured  Movement: Pull the knee up towards your chest and hold, repeat on the other leg.  * you should feel a stretch in the buttocks of the leg that is crossed. Hold 30 sec 2 times each leg.   Piriformis Stretch, Sitting    Sit, one ankle on opposite knee, same-side hand on crossed knee. Push down on knee, keeping spine straight. Lean torso forward, with flat back, until tension is felt in hamstrings and gluteals of crossed-leg side. Hold __30_ seconds.  Repeat _2__ times per session. Do __2_ sessions per day.  Copyright  VHI. All rights reserved.  Chair Sitting    Sit at edge of seat, spine straight, one leg extended. Put a hand on each thigh and bend forward from the hip, keeping spine straight. Allow hand on extended leg to reach toward toes. Support upper body with other arm. Hold _30__ seconds. Repeat _2__ times per  session. Do _2__ sessions per day.  Copyright  VHI. All rights reserved.  Roll tennis ball under feet for 2-3 min 1 time per day.   [facility] [address], [place], [place] [address] Phone # [phone] Fax [phone]

## 2016-07-31 NOTE — Therapy (Signed)
[facility] [address], [place], [place], [address] Phone: [phone]   Fax:  [phone]  Physical Therapy Treatment  Patient Details  Name: Katelyn King MRN: [id number] Date of Birth: [birth year] Referring Provider: Dr. Janyth Pupa  Encounter Date: 07/31/2016      PT End of Session - 07/31/16 1128    Visit Number 3   Date for PT Re-Evaluation 11/03/16   PT Start Time 0930   PT Stop Time 1015   PT Time Calculation (min) 45 min   Activity Tolerance Patient tolerated treatment well   Behavior During Therapy [facility] for tasks assessed/performed      Past Medical History:  Diagnosis Date  . Anxiety   . Migraine   . Mood disorder [facility])     Past Surgical History:  Procedure Laterality Date  . LAPAROSCOPIC OOPHERECTOMY      There were no vitals filed for this visit.      Subjective Assessment - 07/31/16 0930    Subjective I have been able to do the perineal massage.    Patient Stated Goals decreased pain with sex   Currently in Pain? Yes   Pain Score 10-Worst pain ever   Pain Location Vagina   Pain Orientation Mid   Pain Descriptors / Indicators Stabbing;Sharp   Pain Type Chronic pain   Pain Onset More than a month ago   Pain Frequency Intermittent   Aggravating Factors  intercourse, vaginal exam   Pain Relieving Factors no intercourse                         [facility] Adult PT Treatment/Exercise - 07/31/16 0001      Posture/Postural Control   Posture Comments sitting upright; no slouching     Therapeutic Activites    Therapeutic Activities Other Therapeutic Activities     Exercises   Exercises Lumbar     Lumbar Exercises: Seated   Other Seated Lumbar Exercises sitting on physioball- sitting with pelvic tilt, pelvic sway, diagonals, rotation; supine with knees to chest and butterfly postion with feet on ball                PT Education - 07/31/16 1128    Education  provided Yes   Education Details stretches and using the ball to relax foot muscles; posture education   Person(s) Educated Patient   Methods Explanation;Demonstration;Handout;Verbal cues   Comprehension Returned demonstration;Verbalized understanding          PT Short Term Goals - 07/31/16 1137      PT SHORT TERM GOAL #2   Title understand how to use a dilator to expand the vaginal canal   Time 4   Period Weeks   Status On-going  has not gotten one yet     PT SHORT TERM GOAL #3   Title ability to relax the pelvic floor using mediatation   Time 4   Period Weeks   Status On-going  has not had a chance to do at home     PT SHORT TERM GOAL #4   Title independent with self perineal massage   Time 4   Period Weeks   Status Achieved     PT SHORT TERM GOAL #5   Title Marinoff score 2/3   Time 4   Period Weeks   Status On-going  has not had intercourse           PT Long Term Goals - 07/06/16  [place] #1   Title independent with HEP   Time 4   Period Months   Status New     PT LONG TERM GOAL #2   Title Marinoff score is 1/3 or less due to reduction in pain with penile penetration   Time 4   Period Months   Status New     PT LONG TERM GOAL #3   Title pain with intercourse decreased >/= 75% due to ability to relax pelvic floor    Time 4   Period Months   Status New     PT LONG TERM GOAL #4   Title on the largest dilator due to expansion of the vaginal introitus   Time 4   Period Months   Status New               Plan - 07/31/16 1129    Clinical Impression Statement Patient slouches alot with her sitting posture and is aware on how to correct it. Patient is performing self perineal soft tissue work.  Patient has not had intercourse yet.  Patient has not had a chance to do the pelvic floor meditation. Patient has increased softness of the pelvic floor muscles but they are still tight.  Patient will benefit from skilled therapy to  continue to relax and stretch muscles.    Rehab Potential Excellent   Clinical Impairments Affecting Rehab Potential None   PT Frequency 1x / week   PT Duration Other (comment)  4 months   PT Treatment/Interventions Biofeedback;Cryotherapy;Electrical Stimulation;Ultrasound;Moist Heat;Therapeutic activities;Therapeutic exercise;Neuromuscular re-education;Patient/family education;Passive range of motion;Scar mobilization;Manual techniques;Dry needling;Energy conservation   PT Next Visit Plan abdominal soft tissue work and pelvic floor soft tissue work   PT Home Exercise Plan bulging of pelvic floor   Consulted and Agree with Plan of Care Patient      Patient will benefit from skilled therapeutic intervention in order to improve the following deficits and impairments:  Decreased activity tolerance, Decreased strength, Decreased mobility, Decreased scar mobility, Impaired flexibility, Pain, Increased fascial restricitons, Increased muscle spasms  Visit Diagnosis: Other muscle spasm  Muscle weakness (generalized)     Problem List Patient Active Problem List   Diagnosis Date Noted  . Migraine 06/11/2015    Earlie Counts, PT 07/31/16 11:39 AM    [facility] [address], [place], [place], [address] Phone: [phone]   Fax:  [phone]  Name: Katelyn King MRN: [id number] Date of Birth: 02-20-1991

## 2016-08-03 ENCOUNTER — Encounter: Payer: Self-pay | Admitting: Physical Therapy

## 2016-08-03 ENCOUNTER — Ambulatory Visit: Payer: 59 | Admitting: Physical Therapy

## 2016-08-03 DIAGNOSIS — M6281 Muscle weakness (generalized): Secondary | ICD-10-CM

## 2016-08-03 DIAGNOSIS — M62838 Other muscle spasm: Secondary | ICD-10-CM | POA: Diagnosis not present

## 2016-08-03 NOTE — Therapy (Signed)
[facility] [address] [place], [place], [address] Phone: [phone]   Fax:  [phone]  Physical Therapy Treatment  Patient Details  Name: Katelyn King MRN: [id number] Date of Birth: Jan 11, 1991 Referring Provider: Dr. Janyth Pupa  Encounter Date: 08/03/2016      PT End of Session - 08/03/16 1020    Visit Number 4   Date for PT Re-Evaluation 11/03/16   PT Start Time 0930   PT Stop Time 1015   PT Time Calculation (min) 45 min   Activity Tolerance Patient tolerated treatment well   Behavior During Therapy [facility] for tasks assessed/performed      Past Medical History:  Diagnosis Date  . Anxiety   . Migraine   . Mood disorder [facility])     Past Surgical History:  Procedure Laterality Date  . LAPAROSCOPIC OOPHERECTOMY      There were no vitals filed for this visit.      Subjective Assessment - 08/03/16 0934    Subjective I got a ball.    Patient Stated Goals decreased pain with sex   Currently in Pain? Yes   Pain Score 10-Worst pain ever   Pain Location Vagina   Pain Orientation Mid   Pain Descriptors / Indicators Sharp;Stabbing   Pain Type Chronic pain   Pain Onset More than a month ago   Pain Frequency Intermittent   Aggravating Factors  intercourse, vaginal exam   Pain Relieving Factors no intercourse   Multiple Pain Sites No                         [facility] Adult PT Treatment/Exercise - 08/03/16 0001      Self-Care   Self-Care Other Self-Care Comments   Other Self-Care Comments  education on using a dilator using the sample dilator and pelvic floor model     Exercises   Exercises Other Exercises   Other Exercises  sit on green physioball- pelvic sway, pelvic tilt, pelvic circles, figure eight, supine to stretch chest, prone with hip adductor stretch     Manual Therapy   Manual Therapy Soft tissue mobilization   Soft tissue mobilization abdominal soft tissue work to release  the tightness and trigger points                PT Education - 08/03/16 1020    Education provided Yes   Education Details how to use a dilator on the pelvic floor   Person(s) Educated Patient   Methods Explanation;Verbal cues;Handout;Demonstration   Comprehension Verbalized understanding;Returned demonstration          PT Short Term Goals - 08/03/16 0937      PT SHORT TERM GOAL #2   Title understand how to use a dilator to expand the vaginal canal   Time 4   Period Weeks   Status On-going  just got them     PT SHORT TERM GOAL #3   Title ability to relax the pelvic floor using mediatation   Time 4   Period Weeks   Status On-going  has not done at home yet     PT SHORT TERM GOAL #4   Title independent with self perineal massage   Time 4   Period Weeks   Status Achieved     PT SHORT TERM GOAL #5   Title Marinoff score 2/3   Time 4   Period Weeks   Status On-going  no sex yet  PT Long Term Goals - 07/06/16 1044      PT LONG TERM GOAL #1   Title independent with HEP   Time 4   Period Months   Status New     PT LONG TERM GOAL #2   Title Marinoff score is 1/3 or less due to reduction in pain with penile penetration   Time 4   Period Months   Status New     PT LONG TERM GOAL #3   Title pain with intercourse decreased >/= 75% due to ability to relax pelvic floor    Time 4   Period Months   Status New     PT LONG TERM GOAL #4   Title on the largest dilator due to expansion of the vaginal introitus   Time 4   Period Months   Status New               Plan - 08/03/16 1022    Clinical Impression Statement Patient has a physioball to sit on and work on her posture.  Patient abdominal muscles are becoming softer.  Patient still needs verbal cues on correct sitting posture. Pateint still has not had intercourse.  Patient has gotten her dilators and was educated on using them.  Patient will benefit from skilled therapy  to continue  to relax and stretch muscles.    Rehab Potential Excellent   Clinical Impairments Affecting Rehab Potential None   PT Frequency 1x / week   PT Duration Other (comment)  4 months   PT Treatment/Interventions Biofeedback;Cryotherapy;Electrical Stimulation;Ultrasound;Moist Heat;Therapeutic activities;Therapeutic exercise;Neuromuscular re-education;Patient/family education;Passive range of motion;Scar mobilization;Manual techniques;Dry needling;Energy conservation   PT Next Visit Plan internal pelvic floor soft tissue work   PT Home Exercise Plan bulging of pelvic floor   Recommended Other Services None   Consulted and Agree with Plan of Care Patient      Patient will benefit from skilled therapeutic intervention in order to improve the following deficits and impairments:  Decreased activity tolerance, Decreased strength, Decreased mobility, Decreased scar mobility, Impaired flexibility, Pain, Increased fascial restricitons, Increased muscle spasms  Visit Diagnosis: Other muscle spasm  Muscle weakness (generalized)     Problem List Patient Active Problem List   Diagnosis Date Noted  . Migraine 06/11/2015    Earlie Counts, PT 08/03/16 10:27 AM   [facility] [address], [place], [place], [address] Phone: [phone]   Fax:  [phone]  Name: Katelyn King MRN: [id number] Date of Birth: [birth year]

## 2016-08-03 NOTE — Patient Instructions (Addendum)
PROTOCOL FOR DILATORS   1. Wash dilator with soap and water prior to insertion.    2. Lay on your back reclined. Knees are to be up and apart while on your bed or in the bathtub with warm water.   3. Lubricate the end of the dilator with a water-soluble lubricant.  4. Separate the labia.   5. Tense the pelvic floor muscles than relax; while relaxing, slide lubricated dilator into the vagina.    6. Tense muscles again while holding the dilator so it does not get pushed out; relax and slide it in a little further. No more than 3/10 pain.  7. Try blowing out as if filling a balloon; this may relax the muscles and allow penetration.  Repeat blowing out to insert dilator further.  8. Keep dilator in for 10 minutes if tolerate, with the pelvic floor muscles relaxed to further stretch the canal.   9. Never force the dilator into the canal.  10. 1-2 times per day  When you progress to the next size start with the smaller size for 2 min. Afterwards place the next size dilator in your vaginal canal.    [facility] [address] [place], [place] [address] Phone # [phone] Fax [phone]

## 2016-08-27 ENCOUNTER — Encounter (HOSPITAL_COMMUNITY): Payer: Self-pay | Admitting: *Deleted

## 2016-08-27 ENCOUNTER — Inpatient Hospital Stay (HOSPITAL_COMMUNITY)
Admission: AD | Admit: 2016-08-27 | Discharge: 2016-08-28 | Disposition: A | Discharge status: Home / Self Care | Payer: 59 | Source: Ambulatory Visit | Attending: Obstetrics & Gynecology | Admitting: Obstetrics & Gynecology

## 2016-08-27 DIAGNOSIS — Z79899 Other long term (current) drug therapy: Secondary | ICD-10-CM | POA: Diagnosis not present

## 2016-08-27 DIAGNOSIS — N3000 Acute cystitis without hematuria: Secondary | ICD-10-CM

## 2016-08-27 DIAGNOSIS — Z803 Family history of malignant neoplasm of breast: Secondary | ICD-10-CM | POA: Insufficient documentation

## 2016-08-27 DIAGNOSIS — I10 Essential (primary) hypertension: Secondary | ICD-10-CM | POA: Diagnosis not present

## 2016-08-27 DIAGNOSIS — R102 Pelvic and perineal pain: Secondary | ICD-10-CM | POA: Insufficient documentation

## 2016-08-27 DIAGNOSIS — Z8249 Family history of ischemic heart disease and other diseases of the circulatory system: Secondary | ICD-10-CM | POA: Insufficient documentation

## 2016-08-27 DIAGNOSIS — F419 Anxiety disorder, unspecified: Secondary | ICD-10-CM | POA: Diagnosis not present

## 2016-08-27 HISTORY — DX: Bipolar II disorder: F31.81

## 2016-08-27 LAB — WET PREP, GENITAL
Clue Cells Wet Prep HPF POC: NONE SEEN
Sperm: NONE SEEN
Trich, Wet Prep: NONE SEEN
Yeast Wet Prep HPF POC: NONE SEEN

## 2016-08-27 LAB — URINALYSIS, ROUTINE W REFLEX MICROSCOPIC
Bilirubin Urine: NEGATIVE
Glucose, UA: NEGATIVE mg/dL
Ketones, ur: NEGATIVE mg/dL
Nitrite: NEGATIVE
Protein, ur: NEGATIVE mg/dL
Specific Gravity, Urine: 1.012 (ref 1.005–1.030)
pH: 6 (ref 5.0–8.0)

## 2016-08-27 LAB — POCT PREGNANCY, URINE: Preg Test, Ur: NEGATIVE

## 2016-08-27 MED ORDER — PHENAZOPYRIDINE HCL 100 MG PO TABS
100.0000 mg | ORAL_TABLET | Freq: Once | ORAL | Status: AC
  Administered 2016-08-27: 100 mg via ORAL
  Filled 2016-08-27: qty 1

## 2016-08-27 MED ORDER — NITROFURANTOIN MONOHYD MACRO 100 MG PO CAPS: 100.0000 mg | ORAL_CAPSULE | Freq: Two times a day (BID) | ORAL | 0 refills | Status: DC

## 2016-08-27 MED ORDER — PHENAZOPYRIDINE HCL 200 MG PO TABS: 200.0000 mg | ORAL_TABLET | Freq: Three times a day (TID) | ORAL | 0 refills | Status: DC | PRN

## 2016-08-27 NOTE — Discharge Instructions (Signed)

## 2016-08-27 NOTE — MAU Note (Signed)
Pt c/o right lower back/flank pain. Thought she pulled a muscle, took a bath and helped. Started having some stabbing pubic pain tonight. C/o pain with urination, but denies frequency and urgency. States she took 2 tylenol and 2 ibuprofen around 3:30, helped but is now wearing off. Rates 4/10. LMP: 08/07/16. Pt has IUD

## 2016-08-27 NOTE — MAU Provider Note (Signed)
History     CSN: [id number]  Arrival date and time: 08/27/16 1941   First Provider Initiated Contact with Patient 08/27/16 2309      Chief Complaint  Patient presents with  . Pelvic Pain   Katelyn King is a 26 y.o. Who presents today with lower abdominal pain since this morning. She states that her pain is between 5-9/10 on the pain scale. Worse with urination. She had an IUD placed about 3 weeks ago, and was advised to come here for evaluation.    Pelvic Pain  The patient's primary symptoms include pelvic pain. The patient's pertinent negatives include no vaginal discharge. This is a new problem. The current episode started today. The problem occurs intermittently. The problem has been waxing and waning. Pain severity now: 5--0/10. The problem affects both sides. She is not pregnant. Associated symptoms include dysuria and flank pain. Pertinent negatives include no chills, fever, hematuria, nausea, urgency or vomiting. The symptoms are aggravated by urinating. She has tried nothing for the symptoms. She uses an IUD for contraception. Menstrual history: LMP 08/07/16       Past Medical History:  Diagnosis Date  . Anxiety   . Bipolar 2 disorder ([place])   . Migraine   . Mood disorder [facility])     Past Surgical History:  Procedure Laterality Date  . LAPAROSCOPIC OOPHERECTOMY      Family History  Problem Relation Age of Onset  . Breast cancer Mother   . Healthy Father   . Breast cancer Paternal Grandmother   . Hypercholesterolemia Paternal Grandmother   . Hypertension Paternal Grandfather   . Hypercholesterolemia Maternal Grandmother   . Heart disease Maternal Grandfather     Social History  Substance Use Topics  . Smoking status: Never Smoker  . Smokeless tobacco: Never Used  . Alcohol use No    Allergies: No Known Allergies  Prescriptions Prior to Admission  Medication Sig Dispense Refill Last Dose  . acetaminophen (TYLENOL) 500 MG tablet Take 1,000 mg by mouth every 6  (six) hours as needed for mild pain or moderate pain.   08/27/2016 at Unknown time  . ibuprofen (ADVIL,MOTRIN) 200 MG tablet Take 600 mg by mouth every 6 (six) hours as needed for moderate pain.   08/27/2016 at Unknown time  . lamoTRIgine (LAMICTAL) 200 MG tablet Take 200 mg by mouth daily.   08/26/2016 at Unknown time  . levonorgestrel (MIRENA) 20 MCG/24HR IUD 1 each by Intrauterine route once.   Continuous  . lurasidone (LATUDA) 40 MG TABS tablet Take 40 mg by mouth daily with breakfast.   08/26/2016 at Unknown time  . MELATONIN PO Take 3 mg by mouth daily.   08/26/2016 at Unknown time  . Probiotic Product (PROBIOTIC PO) Take 1 capsule by mouth daily.   08/27/2016 at Unknown time  . propranolol ER (INDERAL LA) 60 MG 24 hr capsule TAKE 1 CAPSULE(60 MG) BY MOUTH DAILY 30 capsule 11 08/27/2016 at Unknown time  . Riboflavin 100 MG CAPS Take 1 capsule by mouth daily.   08/27/2016 at Unknown time  . rizatriptan (MAXALT-MLT) 5 MG disintegrating tablet Take 1 tablet (5 mg total) by mouth as needed. May repeat in 2 hours if needed 15 tablet 6 Past Month at Unknown time  . Vitamin D, Ergocalciferol, (DRISDOL) 50000 units CAPS capsule Take 50,000 Units by mouth every 7 (seven) days. Takes on Wednesday.   08/26/2016 at Unknown time    Review of Systems  Constitutional: Negative for chills and fever.  Gastrointestinal: Negative for nausea and vomiting.  Genitourinary: Positive for dysuria, flank pain and pelvic pain. Negative for hematuria, urgency, vaginal bleeding and vaginal discharge.   Physical Exam   Blood pressure 104/67, pulse 71, temperature 98.4 F (36.9 C), temperature source Oral, resp. rate 16, height 5\' 10"  (1.778 m), weight 156 lb (70.8 kg), last menstrual period 08/07/2016, SpO2 98 %.  Physical Exam  Nursing note and vitals reviewed. Constitutional: She is oriented to person, place, and time. She appears well-developed and well-nourished. No distress.  HENT:  Head: Normocephalic.  Cardiovascular:  Normal rate.   Respiratory: Effort normal.  GI: Soft. There is no tenderness. There is no rebound.  Genitourinary:  Genitourinary Comments:  External: no lesion Vagina: small amount of white discharge Cervix: pink, smooth, no CMT, IUD string seen  Uterus: NSSC Adnexa: NT   Neurological: She is alert and oriented to person, place, and time.  Skin: Skin is warm and dry.  Psychiatric: She has a normal mood and affect.   Results for orders placed or performed during the hospital encounter of 08/27/16 (from the past 24 hour(s))  Urinalysis, Routine w reflex microscopic     Status: Abnormal   Collection Time: 08/27/16  8:04 PM  Result Value Ref Range   Color, Urine YELLOW YELLOW   APPearance CLOUDY (A) CLEAR   Specific Gravity, Urine 1.012 1.005 - 1.030   pH 6.0 5.0 - 8.0   Glucose, UA NEGATIVE NEGATIVE mg/dL   Hgb urine dipstick SMALL (A) NEGATIVE   Bilirubin Urine NEGATIVE NEGATIVE   Ketones, ur NEGATIVE NEGATIVE mg/dL   Protein, ur NEGATIVE NEGATIVE mg/dL   Nitrite NEGATIVE NEGATIVE   Leukocytes, UA LARGE (A) NEGATIVE   RBC / HPF 0-5 0 - 5 RBC/hpf   WBC, UA 6-30 0 - 5 WBC/hpf   Bacteria, UA MANY (A) NONE SEEN   Squamous Epithelial / LPF 6-30 (A) NONE SEEN   Mucous PRESENT   Pregnancy, urine POC     Status: None   Collection Time: 08/27/16  9:29 PM  Result Value Ref Range   Preg Test, Ur NEGATIVE NEGATIVE  Wet prep, genital     Status: Abnormal   Collection Time: 08/27/16 11:05 PM  Result Value Ref Range   Yeast Wet Prep HPF POC NONE SEEN NONE SEEN   Trich, Wet Prep NONE SEEN NONE SEEN   Clue Cells Wet Prep HPF POC NONE SEEN NONE SEEN   WBC, Wet Prep HPF POC MANY (A) NONE SEEN   Sperm NONE SEEN     MAU Course  Procedures  MDM   Assessment and Plan   1. Acute cystitis without hematuria    DC home Comfort measures reviewed  RX: macrobid BID #14, pyridium PRN #10  Return to MAU as needed FU with OB as planned  Follow-up Information    OZAN, JENNIFER, M, DO  Follow up.   Specialty:  Obstetrics and Gynecology Contact information: [address] [place] [address] [phone]            Mathis Bud 08/27/2016, 11:23 PM

## 2016-08-28 LAB — GC/CHLAMYDIA PROBE AMP (~~LOC~~) NOT AT ARMC
Chlamydia: NEGATIVE
Neisseria Gonorrhea: NEGATIVE

## 2016-08-29 LAB — URINE CULTURE: Culture: NO GROWTH

## 2016-08-31 DIAGNOSIS — E559 Vitamin D deficiency, unspecified: Secondary | ICD-10-CM | POA: Diagnosis not present

## 2016-08-31 DIAGNOSIS — N3000 Acute cystitis without hematuria: Secondary | ICD-10-CM | POA: Diagnosis not present

## 2016-09-02 ENCOUNTER — Ambulatory Visit (INDEPENDENT_AMBULATORY_CARE_PROVIDER_SITE_OTHER): Payer: 59 | Admitting: Nurse Practitioner

## 2016-09-02 ENCOUNTER — Encounter: Payer: Self-pay | Admitting: Physical Therapy

## 2016-09-02 ENCOUNTER — Encounter: Payer: Self-pay | Admitting: Nurse Practitioner

## 2016-09-02 ENCOUNTER — Ambulatory Visit: Payer: 59 | Attending: Obstetrics & Gynecology | Admitting: Physical Therapy

## 2016-09-02 VITALS — BP 110/72 | HR 85 | Wt 156.4 lb

## 2016-09-02 DIAGNOSIS — G43109 Migraine with aura, not intractable, without status migrainosus: Secondary | ICD-10-CM

## 2016-09-02 DIAGNOSIS — M6281 Muscle weakness (generalized): Secondary | ICD-10-CM | POA: Insufficient documentation

## 2016-09-02 DIAGNOSIS — M62838 Other muscle spasm: Secondary | ICD-10-CM | POA: Diagnosis not present

## 2016-09-02 NOTE — Progress Notes (Signed)
I have reviewed and agreed above plan. 

## 2016-09-02 NOTE — Patient Instructions (Addendum)
Continue Inderal at 60mg  daily Continue Maxalt acutely  Call for worsening of headaches Migraine tracker APP if headaches worsen F/U in 6  To 8 months

## 2016-09-02 NOTE — Therapy (Signed)
[facility] [address] [place], [place], [address] Phone: [phone]   Fax:  [phone]  Physical Therapy Treatment  Patient Details  Name: Katelyn King MRN: [id number] Date of Birth: Apr 17, 1991 Referring Provider: Dr. Janyth Pupa  Encounter Date: 09/02/2016      PT End of Session - 09/02/16 1538    Visit Number 5   Date for PT Re-Evaluation 11/03/16   PT Start Time 1530   PT Stop Time 1615   PT Time Calculation (min) 45 min   Activity Tolerance Patient tolerated treatment well   Behavior During Therapy [facility] for tasks assessed/performed      Past Medical History:  Diagnosis Date  . Anxiety   . Bipolar 2 disorder ([place])   . Migraine   . Mood disorder [facility])     Past Surgical History:  Procedure Laterality Date  . LAPAROSCOPIC OOPHERECTOMY      There were no vitals filed for this visit.      Subjective Assessment - 09/02/16 1535    Subjective I have a IUD now and had an allergic reaction to the cream they used.  I just had a UTI.  I have not been able to do the exercises due to the issues I have been having. I was allergic to the first antibiotic they gave me.    Patient Stated Goals decreased pain with sex   Currently in Pain? Yes   Pain Score 3    Pain Location Abdomen   Pain Orientation Lower   Pain Descriptors / Indicators Sore;Stabbing   Pain Type Acute pain   Pain Onset In the past 7 days   Pain Frequency Intermittent   Aggravating Factors  intercourse,vaginal exam, just happens   Pain Relieving Factors no intercourse   Multiple Pain Sites No            [facility] PT Assessment - 09/02/16 0001      Palpation   Palpation comment abdomen is swollen and firm                     [facility] Adult PT Treatment/Exercise - 09/02/16 0001      Manual Therapy   Manual Therapy Soft tissue mobilization;Myofascial release   Soft tissue mobilization abdominal muscles, circular massage to  the abdominal wall    Myofascial Release urogenital and respiratory diaphgram release through 3 planes of fascia                PT Education - 09/02/16 1618    Education provided Yes   Education Details discussed to resume the dilator after UTI is cleared   Person(s) Educated Patient   Methods Explanation;Verbal cues;Handout   Comprehension Verbalized understanding;Returned demonstration          PT Short Term Goals - 09/02/16 1539      PT SHORT TERM GOAL #1   Title independent with Initial HEP   Time 4   Period Weeks   Status Achieved     PT SHORT TERM GOAL #2   Title understand how to use a dilator to expand the vaginal canal   Time 4   Period Weeks   Status Achieved     PT SHORT TERM GOAL #3   Title ability to relax the pelvic floor using mediatation   Time 4   Period Weeks   Status On-going  not able to do yet     PT SHORT TERM GOAL #4  Title independent with self perineal massage   Time 4   Period Weeks   Status Achieved     PT SHORT TERM GOAL #5   Title Marinoff score 2/3   Time 4   Period Weeks   Status On-going  no sex due to pain and alleric reactions           PT Long Term Goals - 07/06/16 1044      PT LONG TERM GOAL #1   Title independent with HEP   Time 4   Period Months   Status New     PT LONG TERM GOAL #2   Title Marinoff score is 1/3 or less due to reduction in pain with penile penetration   Time 4   Period Months   Status New     PT LONG TERM GOAL #3   Title pain with intercourse decreased >/= 75% due to ability to relax pelvic floor    Time 4   Period Months   Status New     PT LONG TERM GOAL #4   Title on the largest dilator due to expansion of the vaginal introitus   Time 4   Period Months   Status New               Plan - 09/02/16 1619    Clinical Impression Statement Patient has not been able to use the dilator due to menstrating, having IUD placed and and having an allergic reaction to the cream  used and now has a UTI.  Patient has not met any goals this visit due to the past information.  Patient abdominal muscles were softer and  more pliable after the soft tissue work.  Patient will benefit from skilled therapy to continue to relax and stretch muscles.    Rehab Potential Excellent   Clinical Impairments Affecting Rehab Potential None   PT Frequency 1x / week   PT Duration Other (comment)  4 months   PT Treatment/Interventions Biofeedback;Cryotherapy;Electrical Stimulation;Ultrasound;Moist Heat;Therapeutic activities;Therapeutic exercise;Neuromuscular re-education;Patient/family education;Passive range of motion;Scar mobilization;Manual techniques;Dry needling;Energy conservation   PT Next Visit Plan internal pelvic floor soft tissue work and abdominal work   PT Home Exercise Plan bulging of pelvic floor   Consulted and Agree with Plan of Care Patient      Patient will benefit from skilled therapeutic intervention in order to improve the following deficits and impairments:  Decreased activity tolerance, Decreased strength, Decreased mobility, Decreased scar mobility, Impaired flexibility, Pain, Increased fascial restricitons, Increased muscle spasms  Visit Diagnosis: Other muscle spasm  Muscle weakness (generalized)     Problem List Patient Active Problem List   Diagnosis Date Noted  . Migraine 06/11/2015    Earlie Counts, PT 09/02/16 4:23 PM   [facility] [address], [place], [place], [address] Phone: [phone]   Fax:  [phone]  Name: Katelyn King MRN: [id number] Date of Birth: 1990-09-14

## 2016-09-02 NOTE — Progress Notes (Signed)
[facility]  PATIENT: Katelyn King DOB: Oct 12, 1990   REASON FOR VISIT: Follow-up for migraine HISTORY FROM: Patient    HISTORY OF PRESENT ILLNESS: Katelyn King, 26 year old female returns for follow-up. She has a history of migraines . She is currently on  Inderal LA 60 mg daily .  She is averaging about 5 headaches per month. She continues to have headaches around her menstrual cycle. Her triggers are weather changes hormones and sun light She recently had an IUD placed  She states the "Inderal  has changed my life ". She also has a history of bipolar disorder. She takes melatonin for  insomnia with relief.  January 2017 CT of the brain was normal.She is currently being treated for a UTI She returns for reevaluation  HISTORY: Katelyn King is a 26 years old right-handed female, accompanied by her husband, seen in refer by her primary care physician Dr. Antony Contras for evaluation of chronic migraine  She had a history of bipolar type II, is taking lamotrigine 200 mg every night, Latuda 20 mg every night, She reported a history of migraine since elementary school, her typical migraine are lateralized severe pounding headache was associated light noise sensitivity, lasting 6-8 hours, she brought headache diary at today's visit, she has average 6 to 8 typical migraines each month, moderate to severe, she has been taking over-the-counter Tylenol, sometimes with caffeinated drink to help her headache, occasionally her migraines preceded by visual distortion. Trigger for her headache a sleep deprivation, weather change, stress, Laboratory in 2016, normal glucose 79, LDL 72, normal CBC, hemoglobin 13.8, normal ferritin 70 7.9, TSH 3.79 I have personally reviewed CAT scan of the brain without contrast January 3rd 2016, that was within normal limit    REVIEW OF SYSTEMS: Full 14 system review of systems performed and notable only for those listed, all others are neg:  Constitutional:  neg  Cardiovascular: neg Ear/Nose/Throat: neg  Skin: neg Eyes: neg Respiratory: neg Gastroitestinal: neg  Hematology/Lymphatic: neg  Endocrine: neg Musculoskeletal:neg Allergy/Immunology:  Environmental allergies Neurological migraine headaches Psychiatric: Depression treated by psychiatry Sleep : neg   ALLERGIES: No Known Allergies  HOME MEDICATIONS: Outpatient Medications Prior to Visit  Medication Sig Dispense Refill  . acetaminophen (TYLENOL) 500 MG tablet Take 1,000 mg by mouth every 6 (six) hours as needed for mild pain or moderate pain.    Marland Kitchen ibuprofen (ADVIL,MOTRIN) 200 MG tablet Take 600 mg by mouth every 6 (six) hours as needed for moderate pain.    Marland Kitchen lamoTRIgine (LAMICTAL) 200 MG tablet Take 200 mg by mouth daily.    Marland Kitchen levonorgestrel (MIRENA) 20 MCG/24HR IUD 1 each by Intrauterine route once.    . lurasidone (LATUDA) 40 MG TABS tablet Take 40 mg by mouth daily with breakfast.    . MELATONIN PO Take 3 mg by mouth daily.    . phenazopyridine (PYRIDIUM) 200 MG tablet Take 1 tablet (200 mg total) by mouth 3 (three) times daily as needed for pain. 9 tablet 0  . Probiotic Product (PROBIOTIC PO) Take 1 capsule by mouth daily.    . propranolol ER (INDERAL LA) 60 MG 24 hr capsule TAKE 1 CAPSULE(60 MG) BY MOUTH DAILY 30 capsule 11  . Riboflavin 100 MG CAPS Take 1 capsule by mouth daily.    . rizatriptan (MAXALT-MLT) 5 MG disintegrating tablet Take 1 tablet (5 mg total) by mouth as needed. May repeat in 2 hours if needed 15 tablet 6  . Vitamin D, Ergocalciferol, (DRISDOL) 50000 units  CAPS capsule Take 50,000 Units by mouth every 7 (seven) days. Takes on Wednesday.    . nitrofurantoin, macrocrystal-monohydrate, (MACROBID) 100 MG capsule Take 1 capsule (100 mg total) by mouth 2 (two) times daily. 14 capsule 0   No facility-administered medications prior to visit.     PAST MEDICAL HISTORY: Past Medical History:  Diagnosis Date  . Anxiety   . Bipolar 2 disorder ([place])   .  Migraine   . Mood disorder ([place])     PAST SURGICAL HISTORY: Past Surgical History:  Procedure Laterality Date  . LAPAROSCOPIC OOPHERECTOMY      FAMILY HISTORY: Family History  Problem Relation Age of Onset  . Breast cancer Mother   . Healthy Father   . Breast cancer Paternal Grandmother   . Hypercholesterolemia Paternal Grandmother   . Hypertension Paternal Grandfather   . Hypercholesterolemia Maternal Grandmother   . Heart disease Maternal Grandfather     SOCIAL HISTORY: Social History   Social History  . Marital status: Married    Spouse name: N/A  . Number of children: 0  . Years of education: N/A   Occupational History  . Psychologist, occupational   Social History Main Topics  . Smoking status: Never Smoker  . Smokeless tobacco: Never Used  . Alcohol use No  . Drug use: No  . Sexual activity: Yes    Partners: Male   Other Topics Concern  . Not on file   Social History Narrative   Lives at home with husband.   Right-handed.   No caffeine use.     PHYSICAL EXAM  Vitals:   09/02/16 1023  BP: 110/72  Pulse: 85  Weight: 156 lb 6.4 oz (70.9 kg)   Body mass index is 22.44 kg/m.  Generalized: Well developed, in no acute distress  Head: normocephalic and atraumatic,. Oropharynx benign  Neck: Supple, no carotid bruits  Musculoskeletal: No deformity   Neurological examination   Mentation: Alert oriented to time, place, history taking. Attention span and concentration appropriate. Recent and remote memory intact.  Follows all commands speech and language fluent.   Cranial nerve II-XII: Pupils were equal round reactive to light extraocular movements were full, visual field were full on confrontational test. Facial sensation and strength were normal. hearing was intact to finger rubbing bilaterally. Uvula tongue midline. head turning and shoulder shrug were normal and symmetric.Tongue protrusion into cheek strength was normal. Motor: normal bulk  and tone, full strength in the BUE, BLE, fine finger movements normal, no pronator drift. No focal weakness Sensory: normal and symmetric to light touch, pinprick, and  VibrationIn the upper and lower extremitiesnation: finger-nose-finger, heel-to-shin bilaterally, no dysmetria Reflexes: Brachioradialis 2/2, biceps 2/2, triceps 2/2, patellar 2/2, Achilles 2/2, plantar responses were flexor bilaterally. Gait and Station: Rising up from seated position without assistance, normal stance,  moderate stride, good arm swing, smooth turning, able to perform tiptoe, and heel walking without difficulty. Tandem gait is steady  DIAGNOSTIC DATA (LABS, IMAGING, TESTING) -ASSESSMENT AND PLAN  26 y.o. year old female  has a past medical history of Anxiety; Bipolar 2 disorder ([place]); Migraine; and Mood disorder ([place]). here to follow-up for migraines. Her migraines are in excellent control since being placed on Inderal LA 60 mg daily. She  averages about headaches/migraines a month usually during her cycle   Continue Inderal at 60mg  daily Continue Maxalt acutely  Call for worsening of headaches Migraine tracker APP if headaches worsen F/U in 6  To 8  months  I spent 15 min in total face to face time with the patient more than 50% of which was spent counseling and coordination of care, reviewing test results reviewing medications and discussing and reviewing the diagnosis of migraine and further treatment options if headaches worsen. Try to avoid migraine triggers. Get 8 hours of sleep every night. Avoid over-the-counter medications. , Rayburn Ma, [facility], APRN  [facility] [address] [place], [place] [address] [phone]

## 2016-09-04 DIAGNOSIS — Z5181 Encounter for therapeutic drug level monitoring: Secondary | ICD-10-CM | POA: Diagnosis not present

## 2016-09-09 ENCOUNTER — Ambulatory Visit: Payer: 59 | Admitting: Physical Therapy

## 2016-09-09 ENCOUNTER — Encounter: Payer: Self-pay | Admitting: Physical Therapy

## 2016-09-09 DIAGNOSIS — M6281 Muscle weakness (generalized): Secondary | ICD-10-CM

## 2016-09-09 DIAGNOSIS — M62838 Other muscle spasm: Secondary | ICD-10-CM | POA: Diagnosis not present

## 2016-09-09 NOTE — Patient Instructions (Addendum)
When using the second dilator move your hips in directions as you were having intercourse, and move dilator in and out.  When no more than 3/10 pain move to third dilator. Start with second dilator for 2 min. Then place the third dilator in.  Massage pelvic floor muscles with the second dilator.   [place] [address] [place], [place] [address] Phone # [phone] Fax [phone]

## 2016-09-09 NOTE — Therapy (Signed)
[facility] [address], [place], [place], [address] Phone: [phone]   Fax:  [phone]  Physical Therapy Treatment  Patient Details  Name: Katelyn King MRN: [id number] Date of Birth: 09/08/1990 Referring Provider: Dr. Janyth Pupa  Encounter Date: 09/09/2016      PT End of Session - 09/09/16 1011    Visit Number 6   Date for PT Re-Evaluation 11/03/16   PT Start Time 0930   PT Stop Time 1011   PT Time Calculation (min) 41 min   Activity Tolerance Patient tolerated treatment well   Behavior During Therapy [facility] for tasks assessed/performed      Past Medical History:  Diagnosis Date  . Anxiety   . Bipolar 2 disorder ([place])   . Migraine   . Mood disorder [facility])     Past Surgical History:  Procedure Laterality Date  . LAPAROSCOPIC OOPHERECTOMY      There were no vitals filed for this visit.      Subjective Assessment - 09/09/16 0938    Subjective The doctor feels I may need a prophalatic for recurrent UTI. I am no my cycle. I had blood work done last week. On the second dilator with no more than 1/10.    Patient Stated Goals decreased pain with sex   Currently in Pain? No/denies   Multiple Pain Sites No                         [facility] Adult PT Treatment/Exercise - 09/09/16 0001      Self-Care   Self-Care Other Self-Care Comments   Other Self-Care Comments  education on progression of pelvic dilator with hip movements and soft tissue work     Manual Therapy   Manual Therapy Soft tissue mobilization   Soft tissue mobilization abdominal muscles, circular massage to the abdominal wall                 PT Education - 09/09/16 1009    Education provided Yes   Education Details instruction on how to progress with dilators   Person(s) Educated Patient   Methods Explanation;Demonstration;Verbal cues;Handout   Comprehension Returned demonstration;Verbalized understanding          PT Short Term Goals - 09/09/16 0948      PT SHORT TERM GOAL #1   Title independent with Initial HEP   Time 4   Period Weeks   Status Achieved     PT SHORT TERM GOAL #2   Title understand how to use a dilator to expand the vaginal canal   Time 4   Period Weeks   Status Achieved     PT SHORT TERM GOAL #3   Title ability to relax the pelvic floor using mediatation   Time 4   Period Weeks   Status Achieved     PT SHORT TERM GOAL #4   Title independent with self perineal massage   Time 4   Period Weeks   Status Achieved     PT SHORT TERM GOAL #5   Title Marinoff score 2/3   Time 4   Period Weeks   Status Achieved           PT Long Term Goals - 07/06/16 1044      PT LONG TERM GOAL #1   Title independent with HEP   Time 4   Period Months   Status New     PT LONG TERM GOAL #  2   Title Marinoff score is 1/3 or less due to reduction in pain with penile penetration   Time 4   Period Months   Status New     PT LONG TERM GOAL #3   Title pain with intercourse decreased >/= 75% due to ability to relax pelvic floor    Time 4   Period Months   Status New     PT LONG TERM GOAL #4   Title on the largest dilator due to expansion of the vaginal introitus   Time 4   Period Months   Status New               Plan - 09/09/16 1013    Clinical Impression Statement Patient is having her cycle so she is unable to have soft tissue work.  Patient has met all STG's.  Patient had increased abdominal muscles after soft tissue work. Patient is on the second dilator with no more than 1/10 pain.  Patient is ready to progress to the third dilator.  Patient will benefit from skilled therapy to continue to relax and stretch muscles.    Rehab Potential Excellent   Clinical Impairments Affecting Rehab Potential None   PT Frequency 1x / week   PT Duration Other (comment)  4 months   PT Treatment/Interventions Biofeedback;Cryotherapy;Electrical Stimulation;Ultrasound;Moist  Heat;Therapeutic activities;Therapeutic exercise;Neuromuscular re-education;Patient/family education;Passive range of motion;Scar mobilization;Manual techniques;Dry needling;Energy conservation   PT Next Visit Plan internal pelvic floor soft tissue work and abdominal work; postural exercises   PT Home Exercise Plan bulging of pelvic floor   Consulted and Agree with Plan of Care Patient      Patient will benefit from skilled therapeutic intervention in order to improve the following deficits and impairments:  Decreased activity tolerance, Decreased strength, Decreased mobility, Decreased scar mobility, Impaired flexibility, Pain, Increased fascial restricitons, Increased muscle spasms  Visit Diagnosis: Muscle weakness (generalized)     Problem List Patient Active Problem List   Diagnosis Date Noted  . Migraine 06/11/2015    Earlie Counts, PT 09/09/16 10:17 AM   [facility] [address] [place], [place], [address] Phone: [phone]   Fax:  [phone]  Name: Katelyn King MRN: [id number] Date of Birth: Oct 07, 1990

## 2016-09-16 ENCOUNTER — Encounter: Payer: Self-pay | Admitting: Physical Therapy

## 2016-09-16 ENCOUNTER — Ambulatory Visit: Payer: 59 | Admitting: Physical Therapy

## 2016-09-16 DIAGNOSIS — M62838 Other muscle spasm: Secondary | ICD-10-CM

## 2016-09-16 DIAGNOSIS — M6281 Muscle weakness (generalized): Secondary | ICD-10-CM

## 2016-09-16 NOTE — Therapy (Signed)
[facility] [address], [place], [place], [address] Phone: [phone]   Fax:  [phone]  Physical Therapy Treatment  Patient Details  Name: Katelyn King MRN: [id number] Date of Birth: 1991-03-21 Referring Provider: Dr. Janyth Pupa  Encounter Date: 09/16/2016      PT End of Session - 09/16/16 1010    Visit Number 7   Date for PT Re-Evaluation 11/03/16   PT Start Time 0930   PT Stop Time 1012   PT Time Calculation (min) 42 min   Activity Tolerance Patient tolerated treatment well   Behavior During Therapy [facility] for tasks assessed/performed      Past Medical History:  Diagnosis Date  . Anxiety   . Bipolar 2 disorder ([place])   . Migraine   . Mood disorder [facility])     Past Surgical History:  Procedure Laterality Date  . LAPAROSCOPIC OOPHERECTOMY      There were no vitals filed for this visit.      Subjective Assessment - 09/16/16 0935    Subjective I am able to use the #2 dilator without pain.  I feel it is the pads causing UTI so I ordered INXs underware for when I have my cycle. I have not had intercourse.    Patient Stated Goals decreased pain with sex   Currently in Pain? No/denies                      Pelvic Floor Special Questions - 09/16/16 0001    Pelvic Floor Internal Exam Patient confirms identification and approves PT to assess muscle integrity   Exam Type Vaginal           [facility] Adult PT Treatment/Exercise - 09/16/16 0001      Lumbar Exercises: Stretches   Quadruped Mid Back Stretch 2 reps;30 seconds  prayer stretch   Piriformis Stretch 1 rep;30 seconds  bil. pigeon pose     Lumbar Exercises: Standing   Other Standing Lumbar Exercises squat holding onto green physioball on mat working excursion of hips   Other Standing Lumbar Exercises legs apart breathing with trunk extension going into flexion; ; warrior 1 and 2 with breathing then stand and forward fold     Lumbar Exercises: Seated   Other Seated Lumbar Exercises sit on green physioball-diagonals, pelvic sway, pelvic circle     Manual Therapy   Manual Therapy Internal Pelvic Floor   Internal Pelvic Floor bil. obturator internist and levator ani with right is tender and left is not                  PT Short Term Goals - 09/09/16 0948      PT SHORT TERM GOAL #1   Title independent with Initial HEP   Time 4   Period Weeks   Status Achieved     PT SHORT TERM GOAL #2   Title understand how to use a dilator to expand the vaginal canal   Time 4   Period Weeks   Status Achieved     PT SHORT TERM GOAL #3   Title ability to relax the pelvic floor using mediatation   Time 4   Period Weeks   Status Achieved     PT SHORT TERM GOAL #4   Title independent with self perineal massage   Time 4   Period Weeks   Status Achieved     PT SHORT TERM GOAL #5   Title Marinoff score 2/3  Time 4   Period Weeks   Status Achieved           PT Long Term Goals - 09/16/16 6226      PT LONG TERM GOAL #1   Title independent with HEP   Period Months   Status On-going  learning     PT LONG TERM GOAL #2   Title Marinoff score is 1/3 or less due to reduction in pain with penile penetration   Time 4   Period Months   Status On-going  not able to due to menstration and UTI     PT LONG TERM GOAL #3   Title pain with intercourse decreased >/= 75% due to ability to relax pelvic floor    Time 4   Period Months   Status On-going  not able to      PT LONG TERM GOAL #4   Title on the largest dilator due to expansion of the vaginal introitus   Time 4   Period Months   Status On-going  on #2 dilator               Plan - 09/16/16 1010    Clinical Impression Statement Patient is able to do the second dilator without diffficulty so she is ready for the third.  Patient has increased tenderness and tightness on the right compared to the left.  Patient will be able to try  intercourse with her husband this week.  Patient pelvic floor strength is 5/5.  Patient will benefit from skilled therapy to relax and strength pelvic floor for penile penetration.    Rehab Potential Excellent   Clinical Impairments Affecting Rehab Potential None   PT Frequency 1x / week   PT Duration Other (comment)  4 months   PT Treatment/Interventions Biofeedback;Cryotherapy;Electrical Stimulation;Ultrasound;Moist Heat;Therapeutic activities;Therapeutic exercise;Neuromuscular re-education;Patient/family education;Passive range of motion;Scar mobilization;Manual techniques;Dry needling;Energy conservation   PT Next Visit Plan internal pelvic floor soft tissue work and abdominal work; postural exercises   PT Home Exercise Plan progress as needed   Consulted and Agree with Plan of Care Patient      Patient will benefit from skilled therapeutic intervention in order to improve the following deficits and impairments:  Decreased activity tolerance, Decreased strength, Decreased mobility, Decreased scar mobility, Impaired flexibility, Pain, Increased fascial restricitons, Increased muscle spasms  Visit Diagnosis: Muscle weakness (generalized)  Other muscle spasm     Problem List Patient Active Problem List   Diagnosis Date Noted  . Migraine 06/11/2015    Earlie Counts, PT 09/16/16 10:15 AM    [place] [facility] [address], [place], [place], [address] Phone: [phone]   Fax:  [phone]  Name: Katelyn King MRN: [id number] Date of Birth: May 17, 1991

## 2016-09-23 ENCOUNTER — Encounter: Payer: Self-pay | Admitting: Physical Therapy

## 2016-09-24 DIAGNOSIS — R3915 Urgency of urination: Secondary | ICD-10-CM | POA: Diagnosis not present

## 2016-09-30 ENCOUNTER — Encounter: Payer: Self-pay | Admitting: Physical Therapy

## 2016-09-30 ENCOUNTER — Ambulatory Visit: Payer: 59 | Attending: Obstetrics & Gynecology | Admitting: Physical Therapy

## 2016-09-30 DIAGNOSIS — M62838 Other muscle spasm: Secondary | ICD-10-CM | POA: Insufficient documentation

## 2016-09-30 DIAGNOSIS — M6281 Muscle weakness (generalized): Secondary | ICD-10-CM | POA: Diagnosis not present

## 2016-09-30 NOTE — Therapy (Signed)
[facility] [address] [place], [place], [address] Phone: [phone]   Fax:  [phone]  Physical Therapy Treatment  Patient Details  Name: Katelyn King MRN: [id number] Date of Birth: [birth year] Referring Provider: Dr. Janyth Pupa  Encounter Date: 09/30/2016      PT End of Session - 09/30/16 0958    Visit Number 8   Date for PT Re-Evaluation 11/03/16   PT Start Time 0904   PT Stop Time 0942   PT Time Calculation (min) 38 min   Activity Tolerance Patient tolerated treatment well   Behavior During Therapy [facility] for tasks assessed/performed      Past Medical History:  Diagnosis Date  . Anxiety   . Bipolar 2 disorder ([place])   . Migraine   . Mood disorder [facility])     Past Surgical History:  Procedure Laterality Date  . LAPAROSCOPIC OOPHERECTOMY      There were no vitals filed for this visit.      Subjective Assessment - 09/30/16 0910    Subjective Patient has been intimate 2 times with no pain.  I am on the next size dilator. I was checked for UTI and was negative. Last week I had intense abdominal pain after I urinated and it was a 8/10.  I have had loose stool but have increased my medication.    Patient Stated Goals decreased pain with sex   Currently in Pain? No/denies                         [facility] Adult PT Treatment/Exercise - 09/30/16 0001      Self-Care   Self-Care Other Self-Care Comments   Other Self-Care Comments  education on using femine wash for restoring PH of vagina; ways to relax the pelvic floor when she has bad pain in the lower abdominal area, using heat with the lower abdominal pain     Manual Therapy   Manual Therapy Soft tissue mobilization;Myofascial release   Soft tissue mobilization abdominal muscles, circular massage to the abdominal wall    Myofascial Release urogenital  diaphgram release through 3 planes of fascia                PT Education -  09/30/16 0957    Education provided Yes   Education Details instruction on using vaginal wash to restore PH of vagina; ways to reduce lower abdominal pain using heat and relaxation exercises   Person(s) Educated Patient   Methods Explanation   Comprehension Verbalized understanding          PT Short Term Goals - 09/09/16 0948      PT SHORT TERM GOAL #1   Title independent with Initial HEP   Time 4   Period Weeks   Status Achieved     PT SHORT TERM GOAL #2   Title understand how to use a dilator to expand the vaginal canal   Time 4   Period Weeks   Status Achieved     PT SHORT TERM GOAL #3   Title ability to relax the pelvic floor using mediatation   Time 4   Period Weeks   Status Achieved     PT SHORT TERM GOAL #4   Title independent with self perineal massage   Time 4   Period Weeks   Status Achieved     PT SHORT TERM GOAL #5   Title Marinoff score 2/3   Time 4  Period Weeks   Status Achieved           PT Long Term Goals - 09/30/16 0917      PT LONG TERM GOAL #1   Title independent with HEP   Time 4   Period Months   Status On-going  still learning     PT LONG TERM GOAL #2   Title Marinoff score is 1/3 or less due to reduction in pain with penile penetration   Time 4   Period Months   Status Achieved     PT LONG TERM GOAL #3   Title pain with intercourse decreased >/= 75% due to ability to relax pelvic floor    Time 4   Period Months   Status On-going  2 times without pain     PT LONG TERM GOAL #4   Title on the largest dilator due to expansion of the vaginal introitus   Time 4   Period Months   Status On-going  3rd dilator               Plan - 09/30/16 0959    Clinical Impression Statement Patient was able to have intercourse 2 times without pain.  Patient has moved up to the next dilator.  Patient will use dilator prior to intercourse to relax the vaginal tissue.  Patient had intenense lower abdominal pain last week that could  be due to changes intestinally or muscle cramp.  Patient will benefit form skilled therapy to relax the pelvic floor muscles for penile penetration.    Rehab Potential Excellent   Clinical Impairments Affecting Rehab Potential None   PT Frequency 1x / week   PT Duration Other (comment)  4 months   PT Treatment/Interventions Biofeedback;Cryotherapy;Electrical Stimulation;Ultrasound;Moist Heat;Therapeutic activities;Therapeutic exercise;Neuromuscular re-education;Patient/family education;Passive range of motion;Scar mobilization;Manual techniques;Dry needling;Energy conservation   PT Next Visit Plan internal pelvic floor soft tissue work and abdominal work; postural exercises; possible discharge   PT Home Exercise Plan progress as needed   Consulted and Agree with Plan of Care Patient      Patient will benefit from skilled therapeutic intervention in order to improve the following deficits and impairments:  Decreased activity tolerance, Decreased strength, Decreased mobility, Decreased scar mobility, Impaired flexibility, Pain, Increased fascial restricitons, Increased muscle spasms  Visit Diagnosis: Muscle weakness (generalized)  Other muscle spasm     Problem List Patient Active Problem List   Diagnosis Date Noted  . Migraine 06/11/2015    Earlie Counts, PT 09/30/16 10:02 AM   [facility] [address], [place], [place], [address] Phone: [phone]   Fax:  [phone]  Name: Katelyn King MRN: [id number] Date of Birth: 02-19-1991

## 2016-10-20 ENCOUNTER — Encounter: Payer: Self-pay | Admitting: Physical Therapy

## 2016-10-20 ENCOUNTER — Ambulatory Visit: Payer: 59 | Admitting: Physical Therapy

## 2016-10-20 DIAGNOSIS — M6281 Muscle weakness (generalized): Secondary | ICD-10-CM

## 2016-10-20 DIAGNOSIS — M62838 Other muscle spasm: Secondary | ICD-10-CM

## 2016-10-20 NOTE — Patient Instructions (Signed)
When using the second dilator move your hips in directions as you were having intercourse, and move dilator in and out.  When no more than 3/10 pain move to third dilator. Start with second dilator for 2 min. Then place the third dilator in.  Massage pelvic floor muscles with the second dilator.     While Lying on your back,  hold your knees and gently pull them up towards your chest. Hold 30 sec.  2 times.     In hands and knees with ankles and the feet and knees positioned hip width apart, slowly sink backward towards the heels attempting to touch the buttocks to the heels.  Drop the head and neck and allow the chest to rest on the knees, and the hands maintaining their contact on the floor.  Then you can separate your knees and lay on 2-3 pillow for 2 min.  Restorative Pose: Relaxed Center Pose (Blocks, Support)    Arrange two folded blankets to support spine and head, blocks to support knees. Remain in position for _2___ minutes.  Copyright  VHI. All rights reserved.    Start: Cross one leg over the other as pictured, reach with one hand through the opening your legs and the other outside of that opening to the back of the knee as pictured  Movement: Pull the knee up towards your chest and hold, repeat on the other leg.  * you should feel a stretch in the buttocks of the leg that is crossed. Hold 30 sec 2 times each leg.   Piriformis Stretch, Sitting    Sit, one ankle on opposite knee, same-side hand on crossed knee. Push down on knee, keeping spine straight. Lean torso forward, with flat back, until tension is felt in hamstrings and gluteals of crossed-leg side. Hold __30_ seconds.  Repeat _2__ times per session. Do __2_ sessions per day.  Copyright  VHI. All rights reserved.  Chair Sitting    Sit at edge of seat, spine straight, one leg extended. Put a hand on each thigh and bend forward from the hip, keeping spine straight. Allow hand on extended leg to  reach toward toes. Support upper body with other arm. Hold _30__ seconds. Repeat _2__ times per session. Do _2__ sessions per day.   Sitting    Sit comfortably. Allow body's muscles to relax. Place hands on belly. Inhale slowly and deeply for 3___ seconds, so hands move out. Then take _3__ seconds to exhale. Repeat __5_ times. Do __2_ times a day.  [facility] [place] [address] [place],  [address] Phone # [phone] Fax [phone]

## 2016-10-20 NOTE — Therapy (Signed)
[facility] [address] [place], [place], [address] Phone: [phone]   Fax:  [phone]  Physical Therapy Treatment  Patient Details  Name: Katelyn King MRN: [id number] Date of Birth: 1991/02/18 Referring Provider: Dr. Janyth Pupa  Encounter Date: 10/20/2016      PT End of Session - 10/20/16 1620    Visit Number 9   Date for PT Re-Evaluation 11/03/16   PT Start Time 1615   PT Stop Time 1644   PT Time Calculation (min) 29 min   Activity Tolerance Patient tolerated treatment well   Behavior During Therapy [facility] for tasks assessed/performed      Past Medical History:  Diagnosis Date  . Anxiety   . Bipolar 2 disorder ([place])   . Migraine   . Mood disorder [facility])     Past Surgical History:  Procedure Laterality Date  . LAPAROSCOPIC OOPHERECTOMY      There were no vitals filed for this visit.      Subjective Assessment - 10/20/16 1613    Subjective I want to have sex with my husband is it is great.    Patient Stated Goals decreased pain with sex   Currently in Pain? No/denies            [facility] PT Assessment - 10/20/16 0001      Assessment   Medical Diagnosis N94.10 Unspecified dysparenina   Referring Provider Dr. Janyth Pupa   Onset Date/Surgical Date 05/25/05   Prior Therapy NOne     Precautions   Precautions None     Restrictions   Weight Bearing Restrictions No     Balance Screen   Has the patient fallen in the past 6 months No   Has the patient had a decrease in activity level because of a fear of falling?  No   Is the patient reluctant to leave their home because of a fear of falling?  No     Home [employer] residence     Prior Function   Level of Independence Independent   Vocation Full time employment   Vocation Requirements sitting   Leisure walks     Cognition   Overall Cognitive Status Within Functional Limits for tasks assessed     Observation/Other Assessments   Focus on Therapeutic Outcomes (FOTO)  0% limitation     Strength   Overall Strength Comments bil. hip strength 5/5     Palpation   Palpation comment abdomen is less swollen     Transfers   Transfers Not assessed     Ambulation/Gait   Ambulation/Gait No                  Pelvic Floor Special Questions - 10/20/16 0001    Marinoff Scale no problems                   PT Education - 10/20/16 1643    Education provided Yes   Education Details reviewed HEP and returned demonstration. Gave patient a new printout   Person(s) Educated Patient   Methods Explanation;Demonstration;Handout   Comprehension Verbalized understanding;Returned demonstration          PT Short Term Goals - 09/09/16 0948      PT SHORT TERM GOAL #1   Title independent with Initial HEP   Time 4   Period Weeks   Status Achieved     PT SHORT TERM GOAL #2   Title understand how to  use a dilator to expand the vaginal canal   Time 4   Period Weeks   Status Achieved     PT SHORT TERM GOAL #3   Title ability to relax the pelvic floor using mediatation   Time 4   Period Weeks   Status Achieved     PT SHORT TERM GOAL #4   Title independent with self perineal massage   Time 4   Period Weeks   Status Achieved     PT SHORT TERM GOAL #5   Title Marinoff score 2/3   Time 4   Period Weeks   Status Achieved           PT Long Term Goals - 10/20/16 1616      PT LONG TERM GOAL #1   Title independent with HEP   Time 4   Period Months   Status Achieved     PT LONG TERM GOAL #2   Title Marinoff score is 1/3 or less due to reduction in pain with penile penetration   Time 4   Period Months   Status Achieved     PT LONG TERM GOAL #3   Title pain with intercourse decreased >/= 75% due to ability to relax pelvic floor    Time 4   Period Months   Status Achieved     PT LONG TERM GOAL #4   Title on the largest dilator due to expansion of the  vaginal introitus   Time 4   Period Months   Status Achieved               Plan - 10/20/16 1645    Clinical Impression Statement Patient has met all of her goals.  She is now able to have intercourse without difficulty. Patient is independent with her HEP. Patient has full strength of her hips.  Patient is using her dilator to expand the pelvic floor.  Patient is ready for discharge.    Rehab Potential Excellent   Clinical Impairments Affecting Rehab Potential None   PT Treatment/Interventions Biofeedback;Cryotherapy;Electrical Stimulation;Ultrasound;Moist Heat;Therapeutic activities;Therapeutic exercise;Neuromuscular re-education;Patient/family education;Passive range of motion;Scar mobilization;Manual techniques;Dry needling;Energy conservation   PT Next Visit Plan Discharge to HEP this visit   PT Home Exercise Plan Current HEP   Recommended Other Services MD signed cert on 6/96/7893   Consulted and Agree with Plan of Care Patient      Patient will benefit from skilled therapeutic intervention in order to improve the following deficits and impairments:  Decreased activity tolerance, Decreased strength, Decreased mobility, Decreased scar mobility, Impaired flexibility, Pain, Increased fascial restricitons, Increased muscle spasms  Visit Diagnosis: Muscle weakness (generalized)  Other muscle spasm     Problem List Patient Active Problem List   Diagnosis Date Noted  . Migraine 06/11/2015    GRAY,CHERYL 10/20/2016, 4:50 PM  [facility] [address], [place], [place], [address] Phone: [phone]   Fax:  [phone]  Name: Katelyn King MRN: [id number] Date of Birth: 07-17-1990  PHYSICAL THERAPY DISCHARGE SUMMARY  Visits from Start of Care: 9  Current functional level related to goals / functional outcomes: See above. Patient is able to have intercourse with her husband without difficulty.    Remaining  deficits: Patient reports she is having trouble holding down her food.   Education / Equipment: HEP Plan: Patient agrees to discharge.  Patient goals were met. Patient is being discharged due to meeting the stated rehab goals.  Thank you for the  referral. Earlie Counts, PT 10/20/16 4:50 PM  ?????

## 2016-12-23 ENCOUNTER — Encounter: Payer: Self-pay | Admitting: Neurology

## 2016-12-23 ENCOUNTER — Ambulatory Visit (INDEPENDENT_AMBULATORY_CARE_PROVIDER_SITE_OTHER): Payer: 59 | Admitting: Neurology

## 2016-12-23 VITALS — BP 108/72 | HR 75 | Ht 70.0 in | Wt 156.0 lb

## 2016-12-23 DIAGNOSIS — F518 Other sleep disorders not due to a substance or known physiological condition: Secondary | ICD-10-CM

## 2016-12-23 DIAGNOSIS — F515 Nightmare disorder: Secondary | ICD-10-CM | POA: Diagnosis not present

## 2016-12-23 DIAGNOSIS — R0683 Snoring: Secondary | ICD-10-CM

## 2016-12-23 DIAGNOSIS — R51 Headache: Secondary | ICD-10-CM | POA: Diagnosis not present

## 2016-12-23 DIAGNOSIS — R519 Headache, unspecified: Secondary | ICD-10-CM

## 2016-12-23 DIAGNOSIS — G4719 Other hypersomnia: Secondary | ICD-10-CM | POA: Diagnosis not present

## 2016-12-23 DIAGNOSIS — F39 Unspecified mood [affective] disorder: Secondary | ICD-10-CM | POA: Diagnosis not present

## 2016-12-23 DIAGNOSIS — G478 Other sleep disorders: Secondary | ICD-10-CM

## 2016-12-23 DIAGNOSIS — R6889 Other general symptoms and signs: Secondary | ICD-10-CM

## 2016-12-23 NOTE — Progress Notes (Signed)
Subjective:    Patient ID: Katelyn King is a 26 y.o. female.  HPI     [name], MD, PhD [facility] [address], [address] [place], [place] [address]  Dear Magda Paganini,   I saw your patient, Katelyn King, upon your kind request in my neurologic clinic today for initial consultation of her sleep disorder, in particular, concern for underlying obstructive sleep apnea. The patient is unaccompanied today. As you know, Katelyn King is a 26 year old right-handed woman with an underlying medical history of anxiety, bipolar disorder, and migraine headaches (followed by Dr. Krista Blue and Cecille Rubin, NP), who reports snoring and excessive daytime somnolence, as well as bothersome vivid dreams and nightmares on a nearly night to night basis and not waking up rested on most mornings. I reviewed your office note from 12/02/2016, which you kindly included. Her Epworth Sleepiness Scale is 7 out of 24, fatigue score is 48 out of 63. She does not wake up rested. She lives at home with her husband, they have no children. She does not smoke or drink alcohol and does not use illicit drugs and does not utilize caffeine on a day-to-day basis. Of note, she is on several potentially sedating medications including Latuda, Lamictal, propranolol. She also takes , vitamin D and rizatriptan as needed for migraines. She sometimes wakes up with a headache. Overall, her migraine headaches have improved and her frequency has been much better than before. She denies telltale symptoms I his legs syndrome. She tries to keep a schedule for her sleep, tries to keep good sleep hygiene. There is no TV in the bedroom and she does not use her cell phone at night in bed. She has nightmares almost on a night to night basis. She has an increased anxiety level recently, otherwise no significant depression currently denies suicidal thoughts. She has a bedtime of around 11 and on most nights she is asleep by 11:30, she  wakes up in the middle of the night several times without apparent reason. Her snoring can be loud and crescendo per husband's description, she does not wake up gasping. She does not have night to night nocturia. Wake time is between 6:45 AM and 7:30 AM. She does not typically take any naps. She works for a [employer].  Her Past Medical History Is Significant For: Past Medical History:  Diagnosis Date  . Anxiety   . Bipolar 2 disorder ([place])   . Migraine   . Mood disorder ([place])     Her Past Surgical History Is Significant For: Past Surgical History:  Procedure Laterality Date  . LAPAROSCOPIC OOPHERECTOMY      Her Family History Is Significant For: Family History  Problem Relation Age of Onset  . Breast cancer Mother   . Healthy Father   . Breast cancer Paternal Grandmother   . Hypercholesterolemia Paternal Grandmother   . Hypertension Paternal Grandfather   . Hypercholesterolemia Maternal Grandmother   . Heart disease Maternal Grandfather     Her Social History Is Significant For: Social History   Social History  . Marital status: Married    Spouse name: N/A  . Number of children: 0  . Years of education: N/A   Occupational History  . Psychologist, occupational   Social History Main Topics  . Smoking status: Never Smoker  . Smokeless tobacco: Never Used  . Alcohol use No  . Drug use: No  . Sexual activity: Yes  Partners: Male   Other Topics Concern  . None   Social History Narrative   Lives at home with husband.   Right-handed.   No caffeine use.    Her Allergies Are:  Allergies  Allergen Reactions  . Lexapro [Escitalopram Oxalate]     Worsened depression.  Santiago Bur [Nitrofurantoin Macrocrystal] Rash  :   Her Current Medications Are:  Outpatient Encounter Prescriptions as of 12/23/2016  Medication Sig  . busPIRone (BUSPAR) 10 MG tablet Take 10 mg by mouth 2 (two) times daily.  Marland Kitchen lamoTRIgine (LAMICTAL) 150 MG tablet Take 300 mg by  mouth at bedtime.  Marland Kitchen levonorgestrel (MIRENA) 20 MCG/24HR IUD 1 each by Intrauterine route once.  . lurasidone (LATUDA) 40 MG TABS tablet Take 40 mg by mouth daily with breakfast.  . Melatonin 3 MG TABS Take 3 mg by mouth at bedtime.  . Probiotic Product (PROBIOTIC PO) Take 1 capsule by mouth daily.  . propranolol ER (INDERAL LA) 60 MG 24 hr capsule TAKE 1 CAPSULE(60 MG) BY MOUTH DAILY  . Riboflavin 100 MG CAPS Take 1 capsule by mouth daily.  . rizatriptan (MAXALT-MLT) 5 MG disintegrating tablet Take 1 tablet (5 mg total) by mouth as needed. May repeat in 2 hours if needed  . Vitamin D, Ergocalciferol, (DRISDOL) 50000 units CAPS capsule Take 50,000 Units by mouth every 7 (seven) days. Takes on Wednesday.  . [DISCONTINUED] acetaminophen (TYLENOL) 500 MG tablet Take 1,000 mg by mouth every 6 (six) hours as needed for mild pain or moderate pain.  . [DISCONTINUED] cephALEXin (KEFLEX) 500 MG capsule 500 mg 2 (two) times daily.  . [DISCONTINUED] ibuprofen (ADVIL,MOTRIN) 200 MG tablet Take 600 mg by mouth every 6 (six) hours as needed for moderate pain.  . [DISCONTINUED] MELATONIN PO Take 3 mg by mouth daily.  . [DISCONTINUED] phenazopyridine (PYRIDIUM) 200 MG tablet Take 1 tablet (200 mg total) by mouth 3 (three) times daily as needed for pain.   No facility-administered encounter medications on file as of 12/23/2016.   :  Review of Systems:  Out of a complete 14 point review of systems, all are reviewed and negative with the exception of these symptoms as listed below: Review of Systems  Neurological:       Pt presents today to discuss her sleep. Pt has never had a sleep study but does endorse snoring. Pt feels that melatonin helps her go to sleep but she wakes up frequently during the night and does not feel refreshed upon awakening.  Epworth Sleepiness Scale 0= would never doze 1= slight chance of dozing 2= moderate chance of dozing 3= high chance of dozing  Sitting and reading: 0 Watching  TV: 1 Sitting inactive in a public place (ex. Theater or meeting): 2 As a passenger in a car for an hour without a break: 1 Lying down to rest in the afternoon: 2 Sitting and talking to someone: 0 Sitting quietly after lunch (no alcohol): 1 In a car, while stopped in traffic: 0 Total: 7     Objective:  Neurological Exam  Physical Exam Physical Examination:   Vitals:   12/23/16 0845  BP: 108/72  Pulse: 75    General Examination: The patient is a very pleasant 26 y.o. female in no acute distress. She appears well-developed and well-nourished and well groomed.   HEENT: Normocephalic, atraumatic, pupils are equal, round and reactive to light and accommodation. Funduscopic exam is normal with sharp disc margins noted. Extraocular tracking is good without limitation to gaze  excursion or nystagmus noted. Normal smooth pursuit is noted. Hearing is grossly intact. Face is symmetric with normal facial animation and normal facial sensation. Speech is clear with no dysarthria noted. There is no hypophonia. There is no lip, neck/head, jaw or voice tremor. Neck is supple with full range of passive and active motion. There are no carotid bruits on auscultation. Oropharynx exam reveals: mild mouth dryness, adequate dental hygiene and moderate airway crowding, due to smaller airway entry and tonsils in place, uvula fleshy. Mallampati is class II. Tongue protrudes centrally and palate elevates symmetrically. Tonsils are 1+ in size. Neck size is 13.5 inches. She has a mild overbite. Nasal inspection reveals no significant nasal mucosal bogginess or redness and no septal deviation.   Chest: Clear to auscultation without wheezing, rhonchi or crackles noted.  Heart: S1+S2+0, regular and normal without murmurs, rubs or gallops noted.   Abdomen: Soft, non-tender and non-distended with normal bowel sounds appreciated on auscultation.  Extremities: There is no pitting edema in the distal lower extremities  bilaterally. Pedal pulses are intact.  Skin: Warm and dry without trophic changes noted.  Musculoskeletal: exam reveals no obvious joint deformities, tenderness or joint swelling or erythema.   Neurologically:  Mental status: The patient is awake, alert and oriented in all 4 spheres. Her immediate and remote memory, attention, language skills and fund of knowledge are appropriate. There is no evidence of aphasia, agnosia, apraxia or anomia. Speech is clear with normal prosody and enunciation. Thought process is linear. Mood is normal and affect is normal.  Cranial nerves II - XII are as described above under HEENT exam. In addition: shoulder shrug is normal with equal shoulder height noted. Motor exam: Normal bulk, strength and tone is noted. There is no drift, tremor or rebound. Romberg is negative. Reflexes are 2+ throughout. Fine motor skills and coordination: intact with normal finger taps, normal hand movements, normal rapid alternating patting, normal foot taps and normal foot agility.  Cerebellar testing: No dysmetria or intention tremor on finger to nose testing. Heel to shin is unremarkable bilaterally. There is no truncal or gait ataxia.  Sensory exam: intact to light touch in the upper and lower extremities.  Gait, station and balance: She stands easily. No veering to one side is noted. No leaning to one side is noted. Posture is age-appropriate and stance is narrow based. Gait shows normal stride length and normal pace. No problems turning are noted. Tandem walk is unremarkable.               Assessment and Plan:  In summary, Katelyn King is a very pleasant 26 y.o.-year old female with an underlying medical history of anxiety, bipolar disorder, and migraine headaches (followed by Dr. Krista Blue and Cecille Rubin, NP), whose history and physical exam are somewhat concerning for obstructive sleep apnea (OSA). In Addition she reports parasomnias including nightmares and vivid dreams on a nearly  night to night basis. I had a long chat with the patient about my findings and the diagnosis of OSA, its prognosis and treatment options. We talked about medical treatments, surgical interventions and non-pharmacological approaches. I explained in particular the risks and ramifications of untreated moderate to severe OSA, especially with respect to developing cardiovascular disease down the Road, including congestive heart failure, difficult to treat hypertension, cardiac arrhythmias, or stroke. Even type 2 diabetes has, in part, been linked to untreated OSA. Symptoms of untreated OSA include daytime sleepiness, memory problems, mood irritability and mood disorder such as depression and  anxiety, lack of energy, as well as recurrent headaches, especially morning headaches. We talked about trying to maintain a healthy lifestyle in general, as well as the importance of weight control. I encouraged the patient to eat healthy, exercise daily and keep well hydrated, to keep a scheduled bedtime and wake time routine, to not skip any meals and eat healthy snacks in between meals. I advised the patient not to drive when feeling sleepy. I recommended the following at this time: sleep study with potential positive airway pressure titration. (We will score hypopneas at 4%).   I explained the sleep test procedure to the patient and also outlined possible surgical and non-surgical treatment options of OSA, including the use of a custom-made dental device (which would require a referral to a specialist dentist or oral surgeon), upper airway surgical options, such as pillar implants, radiofrequency surgery, tongue base surgery, and UPPP (which would involve a referral to an ENT surgeon). Rarely, jaw surgery such as mandibular advancement may be considered.  I also explained the CPAP treatment option to the patient, who indicated that she would be willing to try CPAP if the need arises. I explained the importance of being  compliant with PAP treatment, not only for insurance purposes but primarily to improve Her symptoms, and for the patient's long term health benefit, including to reduce Her cardiovascular risks. I answered all her questions today and the patient was in agreement. I will see her back after the sleep study is completed and encouraged her to call with any interim questions, concerns, problems or updates.   Thank you very much for allowing me to participate in the care of this nice patient. If I can be of any further assistance to you please do not hesitate to call me at [phone].  Sincerely,   [name], MD, PhD

## 2016-12-23 NOTE — Patient Instructions (Signed)

## 2016-12-30 ENCOUNTER — Telehealth: Payer: Self-pay | Admitting: Neurology

## 2016-12-30 DIAGNOSIS — R0683 Snoring: Secondary | ICD-10-CM

## 2016-12-30 DIAGNOSIS — F515 Nightmare disorder: Secondary | ICD-10-CM

## 2016-12-30 DIAGNOSIS — G4719 Other hypersomnia: Secondary | ICD-10-CM

## 2016-12-30 NOTE — Telephone Encounter (Signed)
Order for HST placed.

## 2016-12-30 NOTE — Telephone Encounter (Signed)
[employer] denied Split sleep and approved HST.  Can I get an order for HST? °

## 2017-01-06 ENCOUNTER — Ambulatory Visit (INDEPENDENT_AMBULATORY_CARE_PROVIDER_SITE_OTHER): Payer: 59 | Admitting: Neurology

## 2017-01-06 DIAGNOSIS — G4733 Obstructive sleep apnea (adult) (pediatric): Secondary | ICD-10-CM

## 2017-01-11 NOTE — Progress Notes (Signed)
Patient referred by Debbora Dus, NP, seen by me on 12/23/16, HST on 01/06/17.     Please call and notify the patient that the recent sleep study showed borderline or mild obstructive sleep apnea with a total AHI of 6.1/hour and O2 nadir of 85%. Given the patient's medical history and sleep related complaints, treatment with positive airway pressure (in the form of autoPAP) is a possible treatment option to see if she feels better after treatment. To that end I recommend treatment for this in the form of autoPAP, which means, that we don't have to bring her in for a sleep study with CPAP, but will let her try an autoPAP machine at home, through a DME company (of her choice, or as per insurance requirement). The DME representative will educate her on how to use the machine, how to put the mask on, etc. I have not yet placed an order in the chart, please let me know, if she would like to try this. Thanks,   [name], MD, PhD [facility])

## 2017-01-11 NOTE — Procedures (Signed)
  [facility] [address] [place], [place] [address] NAME: Katelyn King DOB: 05/29/1990 MEDICAL RECORD [id number] DOS: 01/06/17 REFERRING PHYSICIAN: Debbora Dus STUDY PERFORMED: HST HISTORY: 26 year old woman with a history of anxiety, bipolar disorder, and migraine headaches (followed by Dr. Krista Blue and Cecille Rubin, NP), who reports snoring and excessive daytime somnolence, as well as bothersome vivid dreams. Epworth Sleepiness Score is 7 out of 24, BMI of 22.4. STUDY RESULTS: Total Recording Time: 8h 78m Total Apnea/Hypopnea Index (AHI): 6.1/hour Average Oxygen Saturation: 95% Lowest Oxygen Saturation: 85% (76% per report appears to be error) Average Heart Rate: 65 bpm IMPRESSION: OSA RECOMMENDATION: This home sleep test demonstrates overall borderline or mild obstructive sleep apnea with a total AHI of 6.1/hour and O2 nadir of 85%. Given the patient's medical history and sleep related complaints, treatment with positive airway pressure (in the form of autoPAP) is a possible treatment option. Other treatment options - generally speaking - may include avoidance of supine sleep position along with weight loss, upper airway or jaw surgery in selected patients or the use of an oral appliance in certain patients. ENT evaluation and/or consultation with a maxillofacial surgeon or dentist may be feasible in some instances.    The patient should be cautioned not to drive, work at heights, or operate dangerous or heavy equipment when tired or sleepy. Review and reiteration of good sleep hygiene measures should be pursued with any patient. The patient and her referring provider will be notified of the test results. The patient will be seen in follow up in sleep clinic at [facility].  I certify that I have reviewed the raw data recording prior to the issuance of this report in accordance with the standards of the American Academy of Sleep Medicine (AASM).  [name], MD,  PhD [facility]) Diplomat, ABPN (neurology and sleep)

## 2017-01-12 ENCOUNTER — Telehealth: Payer: Self-pay | Admitting: Neurology

## 2017-01-12 DIAGNOSIS — G4733 Obstructive sleep apnea (adult) (pediatric): Secondary | ICD-10-CM

## 2017-01-12 NOTE — Telephone Encounter (Signed)
Robin Hyler called pt. She advised pt that Dr. Rexene Alberts reviewed their sleep study results and found that patient's home sleep test showed boderline or mild OSA. She recommends autoPAP given the patients medical history. Pt agrees with starting autoPAP and the recommendations. She reviewed PAP compliance expectations with the pt. Pt is agreeable to starting a CPAP. I advised pt that an order will be sent to a DME, [facility], and [facility] will call the pt within about one week after they file with the pt's insurance. [facility] will show the pt how to use the machine, fit for masks, and troubleshoot the CPAP if needed. A follow up appt was made for insurance purposes with Dr. Rexene Alberts on Mar 09 2017 at 1:00 pm. Pt verbalized understanding to arrive 15 minutes early and bring their CPAP. A letter with all of this information in it will be mailed to the pt as a reminder. I verified with the pt that the address we have on file is correct. Pt verbalized understanding of results. Pt had no questions at this time but was encouraged to call back if questions arise.

## 2017-01-12 NOTE — Telephone Encounter (Signed)
-----   Message from [name], MD sent at 01/11/2017  7:52 AM EDT ----- Patient referred by Debbora Dus, NP, seen by me on 12/23/16, HST on 01/06/17.     Please call and notify the patient that the recent sleep study showed borderline or mild obstructive sleep apnea with a total AHI of 6.1/hour and O2 nadir of 85%. Given the patient's medical history and sleep related complaints, treatment with positive airway pressure (in the form of autoPAP) is a possible treatment option to see if she feels better after treatment. To that end I recommend treatment for this in the form of autoPAP, which means, that we don't have to bring her in for a sleep study with CPAP, but will let her try an autoPAP machine at home, through a DME company (of her choice, or as per insurance requirement). The DME representative will educate her on how to use the machine, how to put the mask on, etc. I have not yet placed an order in the chart, please let me know, if she would like to try this. Thanks,   [name], MD, PhD [facility])

## 2017-01-13 NOTE — Telephone Encounter (Signed)
We will set patient up with autoPAP at home, as HST showed recent sleep study showed borderline or mild obstructive sleep apnea with a total AHI of 6.1/hour and O2 nadir of 85%. Given the patient's medical history and sleep related complaints, treatment with autoPAP is a reasonable option to see if she feels better after treatment. Pls process order and notify patient and set up FU in 10 weeks with me or NP.

## 2017-01-13 NOTE — Addendum Note (Signed)
Addended by: [name] on: 01/13/2017 02:17 PM   Modules accepted: Orders

## 2017-02-03 DIAGNOSIS — G4733 Obstructive sleep apnea (adult) (pediatric): Secondary | ICD-10-CM | POA: Diagnosis not present

## 2017-02-27 ENCOUNTER — Other Ambulatory Visit: Payer: Self-pay | Admitting: Nurse Practitioner

## 2017-03-01 NOTE — Telephone Encounter (Signed)
Has appt 03-04-17.

## 2017-03-03 NOTE — Progress Notes (Signed)
[facility]  PATIENT: Katelyn King DOB: [birth year]   REASON FOR VISIT: Follow-up for increased in migraine HISTORY FROM: Patient    HISTORY OF PRESENT ILLNESS: Katelyn King, 26 year old female returns for follow-up. She has a history of migraines . She is currently on  Inderal LA 60 mg daily . This is been a good migraine preventive for her her blood pressure today 99/66.  Her headaches have increased to about 8 per month since last seen . She recently had a visual aura prior to her headache. She has rarely had these in the past. She continues to have headaches around her menstrual cycle. Her triggers are weather changes hormones and sun light She recently had an IUD placed  She states the "Inderal  has changed my life ". She also has a history of bipolar disorder and sees psychiatrist and psychologist. She takes melatonin for  insomnia with relief.  January 2017 CT of the brain was normal. she reports when she takes her Maxalt it gives her a spaced out feeling. Recent sleep study confirmed mild obstructive sleep apnea and she has been on CPAP for about 3 weeks. She returns for reevaluation  HISTORY: Katelyn King is a 26 years old right-handed female, accompanied by her husband, seen in refer by her primary care physician Dr. Antony Contras for evaluation of chronic migraine  She had a history of bipolar type II, is taking lamotrigine 200 mg every night, Latuda 20 mg every night, She reported a history of migraine since elementary school, her typical migraine are lateralized severe pounding headache was associated light noise sensitivity, lasting 6-8 hours, she brought headache diary at today's visit, she has average 6 to 8 typical migraines each month, moderate to severe, she has been taking over-the-counter Tylenol, sometimes with caffeinated drink to help her headache, occasionally her migraines preceded by visual distortion. Trigger for her headache a sleep deprivation, weather  change, stress, Laboratory in 2016, normal glucose 79, LDL 72, normal CBC, hemoglobin 13.8, normal ferritin 70 7.9, TSH 3.79 I have personally reviewed CAT scan of the brain without contrast January 3rd 2016, that was within normal limit    REVIEW OF SYSTEMS: Full 14 system review of systems performed and notable only for those listed, all others are neg:  Constitutional: neg  Cardiovascular: neg Ear/Nose/Throat: neg  Skin: neg Eyes: neg Respiratory: neg Gastroitestinal: neg  Hematology/Lymphatic: neg  Endocrine: Intolerance to heat and cold Musculoskeletal:neg Allergy/Immunology:  Environmental allergies Neurological migraine headaches Psychiatric: Depression treated by psychiatry Sleep : Obstructive sleep apnea with CPAP   ALLERGIES: Allergies  Allergen Reactions  . Lexapro [Escitalopram Oxalate]     Worsened depression.  Santiago Bur [Nitrofurantoin Macrocrystal] Rash    HOME MEDICATIONS: Outpatient Medications Prior to Visit  Medication Sig Dispense Refill  . busPIRone (BUSPAR) 10 MG tablet Take 10 mg by mouth 2 (two) times daily.    Marland Kitchen lamoTRIgine (LAMICTAL) 150 MG tablet Take 300 mg by mouth at bedtime.    Marland Kitchen levonorgestrel (MIRENA) 20 MCG/24HR IUD 1 each by Intrauterine route once.    . lurasidone (LATUDA) 40 MG TABS tablet Take 40 mg by mouth daily with breakfast.    . Melatonin 3 MG TABS Take 3 mg by mouth at bedtime.    . Probiotic Product (PROBIOTIC PO) Take 1 capsule by mouth daily.    . propranolol ER (INDERAL LA) 60 MG 24 hr capsule TAKE 1 CAPSULE(60 MG) BY MOUTH DAILY 30 capsule 12  . Riboflavin 100 MG CAPS  Take 1 capsule by mouth daily.    . rizatriptan (MAXALT-MLT) 5 MG disintegrating tablet Take 1 tablet (5 mg total) by mouth as needed. May repeat in 2 hours if needed 15 tablet 6  . Vitamin D, Ergocalciferol, (DRISDOL) 50000 units CAPS capsule Take 50,000 Units by mouth every 7 (seven) days. Takes on Wednesday.     No facility-administered medications prior  to visit.     PAST MEDICAL HISTORY: Past Medical History:  Diagnosis Date  . Anxiety   . Bipolar 2 disorder ([place])   . Migraine   . Mood disorder ([place])     PAST SURGICAL HISTORY: Past Surgical History:  Procedure Laterality Date  . LAPAROSCOPIC OOPHERECTOMY      FAMILY HISTORY: Family History  Problem Relation Age of Onset  . Breast cancer Mother   . Healthy Father   . Breast cancer Paternal Grandmother   . Hypercholesterolemia Paternal Grandmother   . Hypertension Paternal Grandfather   . Hypercholesterolemia Maternal Grandmother   . Heart disease Maternal Grandfather     SOCIAL HISTORY: Social History   Social History  . Marital status: Married    Spouse name: N/A  . Number of children: 0  . Years of education: N/A   Occupational History  . Psychologist, occupational   Social History Main Topics  . Smoking status: Never Smoker  . Smokeless tobacco: Never Used  . Alcohol use No  . Drug use: No  . Sexual activity: Yes    Partners: Male   Other Topics Concern  . Not on file   Social History Narrative   Lives at home with husband.   Right-handed.   No caffeine use.     PHYSICAL EXAM  Vitals:   03/04/17 0950  BP: 99/66  Pulse: 72  Weight: 157 lb (71.2 kg)  Height: 5\' 10"  (1.778 m)   Body mass index is 22.53 kg/m.  Generalized: Well developed, in no acute distress  Head: normocephalic and atraumatic,. Oropharynx benign  Neck: Supple, Musculoskeletal: No deformity   Neurological examination   Mentation: Alert oriented to time, place, history taking. Attention span and concentration appropriate. Recent and remote memory intact.  Follows all commands speech and language fluent.   Cranial nerve II-XII: Pupils were equal round reactive to light extraocular movements were full, visual field were full on confrontational test. Facial sensation and strength were normal. hearing was intact to finger rubbing bilaterally. Uvula tongue midline.  head turning and shoulder shrug were normal and symmetric.Tongue protrusion into cheek strength was normal. Motor: normal bulk and tone, full strength in the BUE, BLE, fine finger movements normal, no pronator drift. No focal weakness Sensory: normal and symmetric to light touch, pinprick, and  VibrationIn the upper and lower extremities Coordination  finger-nose-finger, heel-to-shin bilaterally, no dysmetria, no tremor Reflexes: Brachioradialis 2/2, biceps 2/2, triceps 2/2, patellar 2/2, Achilles 2/2, plantar responses were flexor bilaterally. Gait and Station: Rising up from seated position without assistance, normal stance,  moderate stride, good arm swing, smooth turning, able to perform tiptoe, and heel walking without difficulty. Tandem gait is steady  DIAGNOSTIC DATA (LABS, IMAGING, TESTING) -ASSESSMENT AND PLAN  26 y.o. year old female  has a past medical history of Anxiety; Bipolar 2 disorder ([place]); Migraine; and Mood disorder ([place]). here to follow-up for worsening of her migraines  she is currently averaging about 8-10 headaches per month. She is currently on Inderal 60 mg daily however blood pressure today 99/66. Would not feel  comfortable increasing the dose    PLAN: Continue Inderal LA 60mg   mg daily for now Add on Topamax 25 mg at night for 1 week then 2 tablets at night as migraine preventative,  Discontinue  Maxalt due to side effects Relpax 20 mg to take acutely Call for worsening of headaches Migraine tracker APP to keep record of headaches for the next 2 months  F/U in 2 months  I spent 25 min in total face to face time with the patient more than 50% of which was spent counseling and coordination of care, reviewing test results reviewing medications and discussing and reviewing the diagnosis of migraine and further treatment options . Discussed additional preventive medications and she decided on Topamax after discussing side effects in addition will try a different preventive  medication due to side effects of Maxalt. Sleep study confirmed obstructive sleep apnea. Continue to use  CPAP every night. Try to avoid migraine triggers.  Avoid over-the-counter medications. Keep regular follow-up appointments with psychiatry and psychologist Dennie Bible, [facility], [facility], APRN  [facility] [address] [place], [place] [address] [phone]

## 2017-03-04 ENCOUNTER — Encounter: Payer: Self-pay | Admitting: Nurse Practitioner

## 2017-03-04 ENCOUNTER — Encounter (INDEPENDENT_AMBULATORY_CARE_PROVIDER_SITE_OTHER): Payer: Self-pay

## 2017-03-04 ENCOUNTER — Ambulatory Visit (INDEPENDENT_AMBULATORY_CARE_PROVIDER_SITE_OTHER): Payer: 59 | Admitting: Nurse Practitioner

## 2017-03-04 DIAGNOSIS — G4733 Obstructive sleep apnea (adult) (pediatric): Secondary | ICD-10-CM

## 2017-03-04 DIAGNOSIS — G43909 Migraine, unspecified, not intractable, without status migrainosus: Secondary | ICD-10-CM | POA: Insufficient documentation

## 2017-03-04 DIAGNOSIS — G43809 Other migraine, not intractable, without status migrainosus: Secondary | ICD-10-CM

## 2017-03-04 DIAGNOSIS — Z9989 Dependence on other enabling machines and devices: Secondary | ICD-10-CM

## 2017-03-04 MED ORDER — TOPIRAMATE 25 MG PO TABS: 25.0000 mg | ORAL_TABLET | Freq: Every day | ORAL | 2 refills | Status: DC

## 2017-03-04 MED ORDER — ELETRIPTAN HYDROBROMIDE 20 MG PO TABS: 20.0000 mg | ORAL_TABLET | ORAL | 2 refills | Status: DC | PRN

## 2017-03-04 NOTE — Patient Instructions (Signed)
Continue Inderal at 60mg  daily migraine preventative Add on Topamax 25 mg at night for 1 week then 2 tablets at night as migraine preventative Discontinue  Maxalt due to side effects Relpax 20 mg to take acutely Call for worsening of headaches Migraine tracker APP if headaches worsen F/U in 2 months

## 2017-03-05 DIAGNOSIS — G4733 Obstructive sleep apnea (adult) (pediatric): Secondary | ICD-10-CM | POA: Diagnosis not present

## 2017-03-07 ENCOUNTER — Encounter: Payer: Self-pay | Admitting: Neurology

## 2017-03-09 ENCOUNTER — Ambulatory Visit (INDEPENDENT_AMBULATORY_CARE_PROVIDER_SITE_OTHER): Payer: 59 | Admitting: Neurology

## 2017-03-09 ENCOUNTER — Encounter: Payer: Self-pay | Admitting: Neurology

## 2017-03-09 VITALS — BP 103/73 | HR 69 | Ht 70.0 in | Wt 157.0 lb

## 2017-03-09 DIAGNOSIS — G4733 Obstructive sleep apnea (adult) (pediatric): Secondary | ICD-10-CM | POA: Diagnosis not present

## 2017-03-09 DIAGNOSIS — Z9989 Dependence on other enabling machines and devices: Secondary | ICD-10-CM

## 2017-03-09 DIAGNOSIS — G43809 Other migraine, not intractable, without status migrainosus: Secondary | ICD-10-CM | POA: Diagnosis not present

## 2017-03-09 NOTE — Progress Notes (Signed)
Subjective:    Patient ID: Katelyn King is a 26 y.o. female.  HPI     Interim history:   Katelyn King is a 26 year old right-handed woman with an underlying medical history of anxiety, bipolar disorder, and migraine headaches (followed by Dr. Krista Blue and Cecille Rubin, NP), who presents for follow-up consultation of his sleep disorder, after recent home sleep testing and starting AutoPap therapy. The patient is unaccompanied today. I first met her on 12/23/2016 at the request of her behavioral health provider, at which time she reported snoring and excessive daytime somnolence, nonrestorative sleep and vivid dreams. I suggested she retry him for a sleep study. Her insurance denied a sleep study and she had a home sleep test on 01/06/2017 which showed an AHI of 6.1 per hour, O2 nadir of 85%. Given her sleep-related complaints I suggested trial of AutoPap therapy.   Today, 03/09/2017: I reviewed her AutoPap compliance data from 02/06/2017 through 03/07/2017 which is a total of 30 days, during which time she used her machine 28 days with percent used days greater than 4 hours at 80%, indicating very good compliance with an average usage of 6 hours and 45 minutes, residual AHI elevated at 11.9 per hour, leak acceptable with the 95th percentile of 8 cm, 95th percentile pressure at 11.5 cm, range of 4-12 cm with EPR. Her residual AHI appears to be primarily obstructive in nature. She reports finding it quite cumbersome to use her AutoPap. She uses a chinstrap and used to have a full facemask but in the past few days change to nasal pillows with chinstrap. She is not noticing any improvement in her headache frequency or sleep consolidation or sleep quality or daytime symptoms. She does have improvement in her snoring per husband's feedback. She stopped taking Topamax as she felt it increased her suicidal ideations. She has an appointment with Hoyle Sauer in December. She is keeping a log on her headaches. She does  endorse stress at work which could be contributing to her migraines as well.  Previously:  12/23/2016: (She) reports snoring and excessive daytime somnolence, as well as bothersome vivid dreams and nightmares on a nearly night to night basis and not waking up rested on most mornings. I reviewed your office note from 12/02/2016, which you kindly included. Her Epworth Sleepiness Scale is 7 out of 24, fatigue score is 48 out of 63. She does not wake up rested. She lives at home with her husband, they have no children. She does not smoke or drink alcohol and does not use illicit drugs and does not utilize caffeine on a day-to-day basis. Of note, she is on several potentially sedating medications including Latuda, Lamictal, propranolol. She also takes , vitamin D and rizatriptan as needed for migraines. She sometimes wakes up with a headache. Overall, her migraine headaches have improved and her frequency has been much better than before. She denies telltale symptoms I his legs syndrome. She tries to keep a schedule for her sleep, tries to keep good sleep hygiene. There is no TV in the bedroom and she does not use her cell phone at night in bed. She has nightmares almost on a night to night basis. She has an increased anxiety level recently, otherwise no significant depression currently denies suicidal thoughts. She has a bedtime of around 11 and on most nights she is asleep by 11:30, she wakes up in the middle of the night several times without apparent reason. Her snoring can be loud and crescendo per husband's description,  she does not wake up gasping. She does not have night to night nocturia. Wake time is between 6:45 AM and 7:30 AM. She does not typically take any naps. She works for a [employer].  Her Past Medical History Is Significant For: Past Medical History:  Diagnosis Date  . Anxiety   . Bipolar 2 disorder ([place])   . Migraine   . Mood disorder ([place])     Her Past Surgical History Is  Significant For: Past Surgical History:  Procedure Laterality Date  . LAPAROSCOPIC OOPHERECTOMY      Her Family History Is Significant For: Family History  Problem Relation Age of Onset  . Breast cancer Mother   . Healthy Father   . Breast cancer Paternal Grandmother   . Hypercholesterolemia Paternal Grandmother   . Hypertension Paternal Grandfather   . Hypercholesterolemia Maternal Grandmother   . Heart disease Maternal Grandfather     Her Social History Is Significant For: Social History   Social History  . Marital status: Married    Spouse name: N/A  . Number of children: 0  . Years of education: N/A   Occupational History  . Psychologist, occupational   Social History Main Topics  . Smoking status: Never Smoker  . Smokeless tobacco: Never Used  . Alcohol use No  . Drug use: No  . Sexual activity: Yes    Partners: Male   Other Topics Concern  . None   Social History Narrative   Lives at home with husband.   Right-handed.   No caffeine use.    Her Allergies Are:  Allergies  Allergen Reactions  . Lexapro [Escitalopram Oxalate]     Worsened depression.  Santiago Bur [Nitrofurantoin Macrocrystal] Rash  :   Her Current Medications Are:  Outpatient Encounter Prescriptions as of 03/09/2017  Medication Sig  . busPIRone (BUSPAR) 10 MG tablet Take 10 mg by mouth 2 (two) times daily.  Marland Kitchen eletriptan (RELPAX) 20 MG tablet Take 1 tablet (20 mg total) by mouth as needed for migraine or headache. May repeat in 2 hours if headache persists or recurs.  . lamoTRIgine (LAMICTAL) 150 MG tablet Take 300 mg by mouth at bedtime.  Marland Kitchen levonorgestrel (MIRENA) 20 MCG/24HR IUD 1 each by Intrauterine route once.  . lurasidone (LATUDA) 40 MG TABS tablet Take 40 mg by mouth daily with breakfast.  . Melatonin 3 MG TABS Take 3 mg by mouth at bedtime.  Marland Kitchen PAZEO 0.7 % SOLN INT 1 GTT IN OU QD PRN  . Probiotic Product (PROBIOTIC PO) Take 1 capsule by mouth daily.  . propranolol ER  (INDERAL LA) 60 MG 24 hr capsule TAKE 1 CAPSULE(60 MG) BY MOUTH DAILY  . Riboflavin 100 MG CAPS Take 1 capsule by mouth daily.  Marland Kitchen topiramate (TOPAMAX) 25 MG tablet Take 1 tablet (25 mg total) by mouth daily. 1 tablet by mouth for 1 week then increase to 2 tablets at night  . Vitamin D, Ergocalciferol, (DRISDOL) 50000 units CAPS capsule Take 50,000 Units by mouth every 7 (seven) days. Takes on Wednesday.   No facility-administered encounter medications on file as of 03/09/2017.   :  Review of Systems:  Out of a complete 14 point review of systems, all are reviewed and negative with the exception of these symptoms as listed below: Review of Systems  Neurological:       Pt states that she hates her cpap because it is not comfortable. Pt reports that topamax is  increasing her suicidal thoughts. Pt sees Dr. Krista Blue and Hoyle Sauer, NP for migraines.    Objective:  Neurological Exam  Physical Exam Physical Examination:   Vitals:   03/09/17 1259  BP: 103/73  Pulse: 69   General Examination: The patient is a very pleasant 25 y.o. female in no acute distress. She appears well-developed and well-nourished and well groomed. Tearful at times.   HEENT: Normocephalic, atraumatic, pupils are equal, round and reactive to light and accommodation. Extraocular tracking is good without limitation to gaze excursion or nystagmus noted. Normal smooth pursuit is noted. Hearing is grossly intact. Face is symmetric with normal facial animation and normal facial sensation. Speech is clear with no dysarthria noted. There is no hypophonia. There is no lip, neck/head, jaw or voice tremor. Neck is supple with full range of passive and active motion. There are no carotid bruits on auscultation. Oropharynx exam reveals: mild mouth dryness, adequate dental hygiene and moderate airway crowding, due to smaller airway entry and tonsils in place, uvula fleshy. Mallampati is class II. Tongue protrudes centrally and palate elevates  symmetrically. Tonsils are 1+ in size.   Chest: Clear to auscultation without wheezing, rhonchi or crackles noted.  Heart: S1+S2+0, regular and normal without murmurs, rubs or gallops noted.   Abdomen: Soft, non-tender and non-distended with normal bowel sounds appreciated on auscultation.  Extremities: There is no pitting edema in the distal lower extremities bilaterally. Pedal pulses are intact.  Skin: Warm and dry without trophic changes noted.  Musculoskeletal: exam reveals no obvious joint deformities, tenderness or joint swelling or erythema.   Neurologically:  Mental status: The patient is awake, alert and oriented in all 4 spheres. Her immediate and remote memory, attention, language skills and fund of knowledge are appropriate. There is no evidence of aphasia, agnosia, apraxia or anomia. Speech is clear with normal prosody and enunciation. Thought process is linear.  Cranial nerves II - XII are as described above under HEENT exam. In addition: shoulder shrug is normal with equal shoulder height noted. Motor exam: Normal bulk, strength and tone is noted. There is no tremor, Romberg is negative. Reflexes are 2 to 3+ throughout. Fine motor skills and coordination: grossly intact.  Cerebellar testing: No dysmetria or intention tremor. There is no truncal or gait ataxia.  Sensory exam: intact to light touch in the upper and lower extremities.  Gait, station and balance: She stands easily. No veering to one side is noted. No leaning to one side is noted. Posture is age-appropriate and stance is narrow based. Gait shows normal stride length and normal pace. No problems turning are noted. Tandem walk is unremarkable.              Assessment and Plan:  In summary, Lucyann Leamer is a very pleasant 26 year old female with an underlying medical history of anxiety, bipolar disorder, and migraine headaches (followed by Dr. Krista Blue and Cecille Rubin, NP), who Presents for follow-up consultation  of her mild to borderline obstructive sleep apnea after home sleep testing on 01/06/2017 showed an AHI of 6.1 and O2 nadir of about 85%. I suggested trial of AutoPap at home in the hope that she would have improved sleep consolidation, sleep quality and se histoimprovement of her daytime somnolence, perhaps improvement of her migraine frequency. Unfortunately, none of this has actually happens. She is commended for trying AutoPap. She is willing to try a little longer since she changed to nasal pillows just a few days ago. She is advised that she should  stay off of Topamax and discuss medication management of her migraines with Hoyle Sauer and Dr. Krista Blue. She has an appointment with Cecille Rubin in December. If at the time she continues to have difficulty with feeding AutoPap she can stop using it at the time. Alternative treatments are limited in her case. She is advised that she may be a candidate for an oral appliance at some point if she wishes to pursue this we can certainly make a referral or she can talk to her own dentist about it. Physical exam is otherwise stable. I suggested she keep her appointment with Cecille Rubin in December and we can take it from there at the time. I answered all her questions today and she was in agreement. She does not currently endorse suicidal ideations. She is tearful as she talks about her stressors recently. She is under the care of Metta Clines for her mood disorder. She had no recent medication changes. I spent 20 minutes in total face-to-face time with the patient, more than 50% of which was spent in counseling and coordination of care, reviewing test results, reviewing medication and discussing or reviewing the diagnosis of OSA its prognosis and treatment options. Pertinent laboratory and imaging test results that were available during this visit with the patient were reviewed by me and considered in my medical decision making (see chart for details).

## 2017-03-09 NOTE — Patient Instructions (Addendum)
Please try the autoPAP a little longer. You can keep the appointment with Cecille Rubin for December and then decide, if you want to stop the autoPAP, it was worth trying.   You stopped the topamax, due to side effects, please keep a log on your migraines, keep well hydrated.   I do commend you for trying the autoPAP. I hope you feel better.

## 2017-03-09 NOTE — Progress Notes (Signed)
I have reviewed and agreed above plan. 

## 2017-03-11 ENCOUNTER — Telehealth: Payer: Self-pay | Admitting: Neurology

## 2017-03-11 DIAGNOSIS — G4733 Obstructive sleep apnea (adult) (pediatric): Secondary | ICD-10-CM

## 2017-03-11 NOTE — Telephone Encounter (Signed)
Please advise patient to get in touch with her DME company to return the machine. They can fax me a form for discontinuation of AutoPap therapy which I will then sign and fax back to the DME company.

## 2017-03-11 NOTE — Telephone Encounter (Signed)
Pt called she is wanting to d/c CPAP now. Her husband complains it is louder than her snoring. She would like to try the dental applicance.

## 2017-03-11 NOTE — Telephone Encounter (Signed)
Patient wishes to stop using her CPAP, her recent sleep study showed borderline or mild obstructive sleep apnea with a total AHI of 6.1/hour and O2 nadir of 85%. Please advise.

## 2017-03-12 NOTE — Telephone Encounter (Signed)
Is it ok to refer for dental appliance, patient requested, or do you not recommend?

## 2017-03-12 NOTE — Telephone Encounter (Signed)
Katelyn King pls advise pt, I place a referral to Dr. Augustina Mood, DDM, for consideration for Oral appl for her mild OSA and snoring. She should hear back from the dentist office within 2 weeks, if not, pt is encouraged to call us back.  I will copy Katelyn King on this. thx

## 2017-03-12 NOTE — Addendum Note (Signed)
Addended by: [name] on: 03/12/2017 12:46 PM   Modules accepted: Orders

## 2017-03-15 ENCOUNTER — Telehealth: Payer: Self-pay | Admitting: Genetic Counselor

## 2017-03-15 ENCOUNTER — Encounter: Payer: Self-pay | Admitting: Genetic Counselor

## 2017-03-15 NOTE — Telephone Encounter (Signed)
Genetic counseling appt has been scheduled for the pt to see Ofri on 11/8 at 1230pm. Pt aware to arrive 15 minutes early. Address verified. Letter mailed.

## 2017-03-15 NOTE — Telephone Encounter (Signed)
I spoke with patient and made her aware that she will need to contact the DME company and make them aware that she is d/c CPAP. I also made her aware that she should hear from Dr. Corky Sing office about setting up an appointment to discuss the oral appliance. Patient voiced understanding and appreciation.

## 2017-03-16 NOTE — Telephone Encounter (Signed)
Referral has been faxed to Dr. Toy Cookey. Sleep study attached.

## 2017-03-18 ENCOUNTER — Encounter: Payer: Self-pay | Admitting: Genetic Counselor

## 2017-03-18 ENCOUNTER — Ambulatory Visit (HOSPITAL_BASED_OUTPATIENT_CLINIC_OR_DEPARTMENT_OTHER): Payer: 59 | Admitting: Genetic Counselor

## 2017-03-18 ENCOUNTER — Other Ambulatory Visit: Payer: 59

## 2017-03-18 DIAGNOSIS — Z803 Family history of malignant neoplasm of breast: Secondary | ICD-10-CM

## 2017-03-18 DIAGNOSIS — Z808 Family history of malignant neoplasm of other organs or systems: Secondary | ICD-10-CM

## 2017-03-18 DIAGNOSIS — Z8041 Family history of malignant neoplasm of ovary: Secondary | ICD-10-CM | POA: Diagnosis not present

## 2017-03-18 DIAGNOSIS — Z315 Encounter for genetic counseling: Secondary | ICD-10-CM | POA: Diagnosis not present

## 2017-03-18 DIAGNOSIS — Z1379 Encounter for other screening for genetic and chromosomal anomalies: Secondary | ICD-10-CM

## 2017-03-18 HISTORY — DX: Encounter for other screening for genetic and chromosomal anomalies: Z13.79

## 2017-03-18 NOTE — Progress Notes (Signed)
[facility]      Initial Visit   Patient Name: Katelyn King Patient DOB: Nov 18, 1990 Patient Age: 26 y.o. Encounter Date: 03/18/2017  Referring Provider: Janyth Pupa, MD  Primary Care Provider: Antony Contras, MD  Reason for Visit: Evaluate for hereditary susceptibility to cancer    Assessment and Plan:  . Katelyn King's family history on her mother's side is concerning for a hereditary predisposition to cancer given the combination of breast and ovarian cancers. Her mother, however, tested negative using a 17-gene panel (BreastNext 2016). The combination of breast cancer and Ashkenazi Jewish ancestry on her paternal side of the family warrants further genetic testing.   . Testing is recommended to determine whether she has a pathogenic mutation that will impact her screening and risk-reduction for cancer. A negative result will be generally reassuring.  . Katelyn King wished to pursue genetic testing and a blood sample will be sent for analysis of the 83 genes on [facility]'s Multi-Cancer panel (ALK, APC, ATM, AXIN2, BAP1, BARD1, BLM, BMPR1A, BRCA1, BRCA2, BRIP1, CASR, CDC73, CDH1, CDK4, CDKN1B, CDKN1C, CDKN2A, CEBPA, CHEK2, CTNNA1, DICER1, DIS3L2, EGFR, EPCAM, FH, FLCN, GATA2, GPC3, GREM1, HOXB13, HRAS, KIT, MAX, MEN1, MET, MITF, MLH1, MSH2, MSH3, MSH6, MUTYH, NBN, NF1, NF2, NTHL1, PALB2, PDGFRA, PHOX2B, PMS2, POLD1, POLE, POT1, PRKAR1A, PTCH1, PTEN, RAD50, RAD51C, RAD51D, RB1, RECQL4, RET, RUNX1, SDHA, SDHAF2, SDHB, SDHC, SDHD, SMAD4, SMARCA4, SMARCB1, SMARCE1, STK11, SUFU, TERC, TERT, TMEM127, TP53, TSC1, TSC2, VHL, WRN, WT1).   . Results should be available in approximately 2-4 weeks, at which point we will contact her and address implications for her as well as address genetic testing for at-risk family members, if needed.     Dr. Jana Hakim was available for questions concerning this case. Total time spent by me in face-to-face counseling was approximately 30  minutes.   _____________________________________________________________________   History of Present Illness: Katelyn King, a 26 y.o. female, is being seen at the [facility] due to a family history of cancer. She presents to clinic today to discuss the possibility of a hereditary predisposition to cancer and discuss whether genetic testing is warranted.  Katelyn King has no personal history of cancer. She recently learned she has Ashkenazi Jewish ancestry on her paternal side of the family.   Past Medical History:  Diagnosis Date  . Anxiety   . Bipolar 2 disorder ([place])   . Family history of breast cancer   . Migraine   . Mood disorder [facility])     Past Surgical History:  Procedure Laterality Date  . LAPAROSCOPIC OOPHERECTOMY      Social History   Social History  . Marital status: Married    Spouse name: N/A  . Number of children: 0  . Years of education: N/A   Occupational History  . Psychologist, occupational   Social History Main Topics  . Smoking status: Never Smoker  . Smokeless tobacco: Never Used  . Alcohol use No  . Drug use: No  . Sexual activity: Yes    Partners: Male   Other Topics Concern  . Not on file   Social History Narrative   Lives at home with husband.   Right-handed.   No caffeine use.     Family History:  During the visit, a 4-generation pedigree was obtained. Family tree will be scanned in the Media tab in Epic  Significant diagnoses include the following:  Family History  Problem  Relation Age of Onset  . Breast cancer Mother 95       BSO 73; Neg genetic testing (17 genes BreastNext 2016)  . Breast cancer Paternal Grandmother 41       deceased 39s  . Hypercholesterolemia Paternal Grandmother   . Hypertension Paternal Grandfather   . Hypercholesterolemia Maternal Grandmother   . Thyroid cancer Maternal Grandmother 48       currently 17  . Heart disease Maternal Grandfather   . Ovarian cancer Maternal  Aunt 63       currently 47    Additionally, Katelyn King does not have children. She has one sister (age 82) and 55 younger brothers. Her mother has only the one sister noted above. Her father (age 43) has 2 brothers and a sister (all in their 54s). He has a maternal half-sister (age 33s).  Katelyn King ancestry is Caucasian - NOS. She recently learned there is Ashkenazi Jewish ancestry on her paternal side. There is no consanguinity.  Discussion: We reviewed the characteristics, features and inheritance patterns of hereditary cancer syndromes and explained the increased likelihood of certain hereditary cancer risk for those with Ashkenazi Jewish ancestry. We discussed her risk of harboring a mutation in the context of her personal and family history. We discussed the process of genetic testing, insurance coverage and implications of results: positive, negative and variant of unknown significance (VUS).    Katelyn King questions were answered to her satisfaction today and she is welcome to call with any additional questions or concerns. Thank you for the referral and allowing [place] to share in the care of your patient.    Steele Berg, MS, [place] Certified Genetic Counselor phone: [phone] [email or web address]   ______________________________________________________________________ For Office Staff:  Number of people involved in session: 1 Was an Intern/ student involved with case: no

## 2017-03-26 ENCOUNTER — Ambulatory Visit: Payer: Self-pay | Admitting: Genetic Counselor

## 2017-03-26 ENCOUNTER — Encounter: Payer: Self-pay | Admitting: Genetic Counselor

## 2017-03-26 DIAGNOSIS — Z1379 Encounter for other screening for genetic and chromosomal anomalies: Secondary | ICD-10-CM

## 2017-03-26 NOTE — Progress Notes (Signed)
[facility]etics [facility]       Genetic Test Results    Patient Name: Katelyn King Patient DOB: May 30, 1990 Patient Age: 26 y.o. Encounter Date: 03/26/2017  Referring Provider: Janyth Pupa, MD  Primary Care Provider: Antony Contras, MD   [name] was called today to discuss genetic test results. Please see the Genetics note from her visit on 03/18/2017 for a detailed discussion of her personal and family history.  Genetic Testing: At the time of [name]'s visit, she decided to pursue genetic testing of multiple genes associated with hereditary susceptibility to cancer. Testing included sequencing and deletion/duplication analysis. Testing did not reveal any pathogenic mutation in any of these genes.  A copy of the genetic test report will be scanned into Epic under the media tab.  The genes analyzed were the 83 genes on [facility]'s Multi-Cancer panel (ALK, APC, ATM, AXIN2, BAP1, BARD1, BLM, BMPR1A, BRCA1, BRCA2, BRIP1, CASR, CDC73, CDH1, CDK4, CDKN1B, CDKN1C, CDKN2A, CEBPA, CHEK2, CTNNA1, DICER1, DIS3L2, EGFR, EPCAM, FH, FLCN, GATA2, GPC3, GREM1, HOXB13, HRAS, KIT, MAX, MEN1, MET, MITF, MLH1, MSH2, MSH3, MSH6, MUTYH, NBN, NF1, NF2, NTHL1, PALB2, PDGFRA, PHOX2B, PMS2, POLD1, POLE, POT1, PRKAR1A, PTCH1, PTEN, RAD50, RAD51C, RAD51D, RB1, RECQL4, RET, RUNX1, SDHA, SDHAF2, SDHB, SDHC, SDHD, SMAD4, SMARCA4, SMARCB1, SMARCE1, STK11, SUFU, TERC, TERT, TMEM127, TP53, TSC1, TSC2, VHL, WRN, WT1).  Since the current test is not perfect, it is possible that there may be a gene mutation that current testing cannot detect, but that chance is small. It is possible that a different genetic factor, which has not yet been discovered or is not on this panel, is responsible for the cancer diagnoses in the family. Again, the likelihood of this is low. No additional testing is recommended at this time for [name].  Cancer Screening: These results are generally reassuring and indicate that Ms.  Scardina does not likely have an increased risk of cancer due to a mutation in one of these genes. We discussed undergoing cancer screenings for individuals in the general population. The [employer]  recommends that women follow the breast screening recommendations below, but that these may need to be modified based on other risk factors such as dense breasts, biopsy history or family history. In her case, screenings may need to start at age 10 and be done more frequently given her mother's age at diagnosis.  Breast awareness - Women should be familiar with their breasts and promptly report changes to their healthcare provider.   Between ages 28-39: Breast exam, risk assessment, and risk reduction counseling by the provider every 1-3 years.   Starting at age 29: Breast exam, risk assessment, and risk reduction counseling by the provider and mammogram every year. The provider may discuss screening with tomosynthesis.  [name] is also recommended to undergo a yearly gynecologic exam and initiate colon cancer screening at age 8.  Family Members: Given the young age of breast cancer in [name]'s mother (age 51), women are also recommended to speak with their own providers about initiating earlier screenings for breast cancer.  Further genetic testing is recommended in [name]'s family as such testing might help [place] be even more confident in interpreting [name]'s own results. Her mother has already had panel testing that was normal, but her aunt who reportedly had ovarian cancer in her 75s is recommended to get tested. Please let us know if we can help facilitate  testing. Genetic counselors can be located in other cities, by visiting the website of the [employer] (ArtistMovie.se) and Field seismologist for a Dietitian by zip code.  Lastly, cancer genetics is a rapidly advancing field and it is possible that new genetic tests will be appropriate for  [name] in the future. We encourage her to remain in contact with [place] on an annual basis so we can update her personal and family histories, and let her know of advances in cancer genetics that may benefit the family. Our contact number was provided. [name] is welcome to call anytime with additional questions.     Steele Berg, MS, [place] Certified Genetic Counselor phone: [phone]

## 2017-04-01 ENCOUNTER — Other Ambulatory Visit: Payer: Self-pay

## 2017-04-05 DIAGNOSIS — G4733 Obstructive sleep apnea (adult) (pediatric): Secondary | ICD-10-CM | POA: Diagnosis not present

## 2017-05-12 DIAGNOSIS — Z5181 Encounter for therapeutic drug level monitoring: Secondary | ICD-10-CM | POA: Diagnosis not present

## 2017-05-12 DIAGNOSIS — G4733 Obstructive sleep apnea (adult) (pediatric): Secondary | ICD-10-CM | POA: Diagnosis not present

## 2017-05-18 NOTE — Progress Notes (Signed)
[facility]  PATIENT: Katelyn King DOB: 29-Jan-1991   REASON FOR VISIT: Follow-up for  migraine HISTORY FROM: Patient    HISTORY OF PRESENT ILLNESS: Katelyn King, 26 year old female returns for follow-up. She has a history of migraines . She is currently on  Inderal LA 60 mg daily . This is been a good migraine preventive for her her blood pressure today 104/67.  She has one migraine per week for which she uses Relpax.  She has other  headaches related to stress.  Recent sleep study confirmed mild obstructive sleep apnea however she has stopped her CPAP and has just been fitted with a dental appliance. She recently had a visual aura prior to her headache. She has rarely had these in the past. She continues to have headaches around her menstrual cycle. Her triggers are weather changes hormones and sun light She recently had an IUD placed  She states the "Inderal  has changed my life ". She also has a history of bipolar disorder and sees psychiatrist and psychologist.  She sees 2 different counselors.  She takes melatonin for  insomnia with relief.  January 2017 CT of the brain was normal.  . She returns for reevaluation  HISTORY: Katelyn King is a 26 years old right-handed female, accompanied by her husband, seen in refer by her primary care physician Dr. Antony Contras for evaluation of chronic migraine  She had a history of bipolar type II, is taking lamotrigine 200 mg every night, Latuda 20 mg every night, She reported a history of migraine since elementary school, her typical migraine are lateralized severe pounding headache was associated light noise sensitivity, lasting 6-8 hours, she brought headache diary at today's visit, she has average 6 to 8 typical migraines each month, moderate to severe, she has been taking over-the-counter Tylenol, sometimes with caffeinated drink to help her headache, occasionally her migraines preceded by visual distortion. Trigger for her headache  a sleep deprivation, weather change, stress, Laboratory in 2016, normal glucose 79, LDL 72, normal CBC, hemoglobin 13.8, normal ferritin 70 7.9, TSH 3.79 I have personally reviewed CAT scan of the brain without contrast January 3rd 2016, that was within normal limit    REVIEW OF SYSTEMS: Full 14 system review of systems performed and notable only for those listed, all others are neg:  Constitutional: neg  Cardiovascular: neg Ear/Nose/Throat: neg  Skin: neg Eyes: neg Respiratory: neg Gastroitestinal: neg  Hematology/Lymphatic: neg  Endocrine: Intolerance to heat and cold Musculoskeletal:neg Allergy/Immunology:  Environmental allergies Neurological migraine headaches Psychiatric: Depression treated by psychiatry Sleep : Obstructive sleep apnea , uses a dental appliance   ALLERGIES: Allergies  Allergen Reactions  . Topiramate Other (See Comments)    suicidal thoughts  . Lexapro [Escitalopram Oxalate]     Worsened depression.  Santiago Bur [Nitrofurantoin Macrocrystal] Rash    HOME MEDICATIONS: Outpatient Medications Prior to Visit  Medication Sig Dispense Refill  . busPIRone (BUSPAR) 10 MG tablet Take 10 mg by mouth 2 (two) times daily.    Marland Kitchen eletriptan (RELPAX) 20 MG tablet Take 1 tablet (20 mg total) by mouth as needed for migraine or headache. May repeat in 2 hours if headache persists or recurs. 10 tablet 2  . lamoTRIgine (LAMICTAL) 150 MG tablet Take 300 mg by mouth at bedtime.    Marland Kitchen levonorgestrel (MIRENA) 20 MCG/24HR IUD 1 each by Intrauterine route once.    . lurasidone (LATUDA) 40 MG TABS tablet Take 40 mg by mouth daily with breakfast.    .  Melatonin 3 MG TABS Take 3 mg by mouth at bedtime.    Marland Kitchen PAZEO 0.7 % SOLN INT 1 GTT IN OU QD PRN  5  . Probiotic Product (PROBIOTIC PO) Take 1 capsule by mouth daily.    . propranolol ER (INDERAL LA) 60 MG 24 hr capsule TAKE 1 CAPSULE(60 MG) BY MOUTH DAILY 30 capsule 12  . Riboflavin 100 MG CAPS Take 1 capsule by mouth daily.    .  Vitamin D, Ergocalciferol, (DRISDOL) 50000 units CAPS capsule Take 50,000 Units by mouth every 7 (seven) days. Takes on Wednesday.    . topiramate (TOPAMAX) 25 MG tablet Take 1 tablet (25 mg total) by mouth daily. 1 tablet by mouth for 1 week then increase to 2 tablets at night 60 tablet 2   No facility-administered medications prior to visit.     PAST MEDICAL HISTORY: Past Medical History:  Diagnosis Date  . Anxiety   . Bipolar 2 disorder ([place])   . Family history of breast cancer   . Genetic testing 03/18/2017   Multi-Cancer panel (83 genes) @ [facility] - No pathogenic mutations detected  . Migraine   . Mood disorder ([place])     PAST SURGICAL HISTORY: Past Surgical History:  Procedure Laterality Date  . LAPAROSCOPIC OOPHERECTOMY      FAMILY HISTORY: Family History  Problem Relation Age of Onset  . Breast cancer Mother 60       BSO 29; Neg genetic testing (17 genes BreastNext 2016)  . Breast cancer Paternal Grandmother 57       deceased 33s  . Hypercholesterolemia Paternal Grandmother   . Hypertension Paternal Grandfather   . Hypercholesterolemia Maternal Grandmother   . Thyroid cancer Maternal Grandmother 60       currently 79  . Heart disease Maternal Grandfather   . Ovarian cancer Maternal Aunt 32       currently 46    SOCIAL HISTORY: Social History   Socioeconomic History  . Marital status: Married    Spouse name: Not on file  . Number of children: 0  . Years of education: Not on file  . Highest education level: Not on file  Social Needs  . Financial resource strain: Not on file  . Food insecurity - worry: Not on file  . Food insecurity - inability: Not on file  . Transportation needs - medical: Not on file  . Transportation needs - non-medical: Not on file  Occupational History  . Occupation: Ship broker    Comment: Investment banker, corporate  Tobacco Use  . Smoking status: Never Smoker  . Smokeless tobacco: Never Used  Substance and Sexual Activity  . Alcohol  use: No    Alcohol/week: 0.0 oz  . Drug use: No  . Sexual activity: Yes    Partners: Male  Other Topics Concern  . Not on file  Social History Narrative   Lives at home with husband.   Right-handed.   No caffeine use.     PHYSICAL EXAM  Vitals:   05/19/17 1542  BP: 104/67  Pulse: 68  Weight: 159 lb 9.6 oz (72.4 kg)  Height: 5\' 10"  (1.778 m)   Body mass index is 22.9 kg/m.  Generalized: Well developed, in no acute distress  Head: normocephalic and atraumatic,. Oropharynx benign  Neck: Supple, Musculoskeletal: No deformity   Neurological examination   Mentation: Alert oriented to time, place, history taking. Attention span and concentration appropriate. Recent and remote memory intact.  Follows all commands speech and language fluent.  Cranial nerve II-XII: Pupils were equal round reactive to light extraocular movements were full, visual field were full on confrontational test. Facial sensation and strength were normal. hearing was intact to finger rubbing bilaterally. Uvula tongue midline. head turning and shoulder shrug were normal and symmetric.Tongue protrusion into cheek strength was normal. Motor: normal bulk and tone, full strength in the BUE, BLE, fine finger movements normal, no pronator drift. No focal weakness Sensory: normal and symmetric to light touch, pinprick, and  VibrationIn the upper and lower extremities Coordination  finger-nose-finger, heel-to-shin bilaterally, no dysmetria, no tremor Reflexes: Brachioradialis 2/2, biceps 2/2, triceps 2/2, patellar 2/2, Achilles 2/2, plantar responses were flexor bilaterally. Gait and Station: Rising up from seated position without assistance, normal stance,  moderate stride, good arm swing, smooth turning, able to perform tiptoe, and heel walking without difficulty. Tandem gait is steady  DIAGNOSTIC DATA (LABS, IMAGING, TESTING) -ASSESSMENT AND PLAN  26 y.o. year old female  has a past medical history of Anxiety,  Bipolar 2 disorder ([place]), Family history of breast cancer, Genetic testing (03/18/2017), Migraine, and Mood disorder ([place]). here to follow-up for  migraines  she is currently averaging about 1 migraine per week She is currently on Inderal 60 mg daily however blood pressure today 104/67 would not feel comfortable increasing the dose The patient is a current patient of Dr. Krista Blue and Dr. Rexene Alberts  who is out of the office today . This note is sent to the work in doctor.      PLAN: Continue Inderal LA 60mg   mg daily for now Relpax 20 mg to take acutely Call for worsening of headaches Migraine tracker APP to keep record of headaches for the next 4 months  F/U in 4 months  I spent 25 min in total face to face time with the patient more than 50% of which was spent counseling and coordination of care, reviewing test results reviewing medications and discussing and reviewing the diagnosis of migraine and further treatment options . Discussed additional preventive medications and she does not wish to change therapy at this time  Sleep study confirmed obstructive sleep apnea.  She stopped CPAP but now has a dental appliance.Try to avoid migraine triggers.  Avoid over-the-counter medications. Keep regular follow-up appointments with psychiatry and psychologist Dennie Bible, [facility], [facility], APRN  [facility] [address] [place], [place] [address] [phone]

## 2017-05-19 ENCOUNTER — Encounter: Payer: Self-pay | Admitting: Nurse Practitioner

## 2017-05-19 ENCOUNTER — Ambulatory Visit (INDEPENDENT_AMBULATORY_CARE_PROVIDER_SITE_OTHER): Payer: 59 | Admitting: Nurse Practitioner

## 2017-05-19 VITALS — BP 104/67 | HR 68 | Ht 70.0 in | Wt 159.6 lb

## 2017-05-19 DIAGNOSIS — G43009 Migraine without aura, not intractable, without status migrainosus: Secondary | ICD-10-CM | POA: Diagnosis not present

## 2017-05-19 DIAGNOSIS — G43109 Migraine with aura, not intractable, without status migrainosus: Secondary | ICD-10-CM

## 2017-05-19 DIAGNOSIS — G4733 Obstructive sleep apnea (adult) (pediatric): Secondary | ICD-10-CM | POA: Diagnosis not present

## 2017-05-19 MED ORDER — ELETRIPTAN HYDROBROMIDE 20 MG PO TABS: 20.0000 mg | ORAL_TABLET | ORAL | 4 refills | Status: DC | PRN

## 2017-05-19 NOTE — Patient Instructions (Signed)
Continue Inderal LA 60mg   mg daily for now Relpax 20 mg to take acutely Call for worsening of headaches Migraine tracker APP to keep record of headaches  F/U in 4 months

## 2017-05-21 NOTE — Progress Notes (Signed)
I reviewed note and agree with plan.   VIKRAM R. PENUMALLI, MD 05/21/2017, 2:10 PM Certified in Neurology, Neurophysiology and Neuroimaging  [facility] [address], [address] , [place] [address] [phone]  

## 2017-05-25 DIAGNOSIS — S060XAA Concussion with loss of consciousness status unknown, initial encounter: Secondary | ICD-10-CM

## 2017-05-25 DIAGNOSIS — S060X9A Concussion with loss of consciousness of unspecified duration, initial encounter: Secondary | ICD-10-CM

## 2017-05-25 HISTORY — DX: Concussion with loss of consciousness of unspecified duration, initial encounter: S06.0X9A

## 2017-05-25 HISTORY — DX: Concussion with loss of consciousness status unknown, initial encounter: S06.0XAA

## 2017-05-31 DIAGNOSIS — L600 Ingrowing nail: Secondary | ICD-10-CM | POA: Diagnosis not present

## 2017-09-15 NOTE — Progress Notes (Addendum)
[facility]  PATIENT: Katelyn King DOB: 1990/07/08   REASON FOR VISIT: Follow-up for  migraine HISTORY FROM: Patient    HISTORY OF PRESENT ILLNESS:UPDATE 4/26/2019CM [name], 27 year old female returns for follow-up with history of migraines and mild obstructive sleep apnea.  She is currently on propanolol LA 60 mg daily with good control of her headaches.  She has had one bad migraine in the last 4 weeks.  She has failed Maxalt and Relpax in the past and really does not want to try another acute medication since her headaches are in good control.  Her migraine triggers are weather changes in hormones and sunlight.  She wears migraine glasses.  She is using a appliance for her obstructive sleep apnea with good results.  She also has a history of bipolar disorder and sees psychiatry and psychologist.  She returns for reevaluation.   05/19/17 CM [name], 27 year old female returns for follow-up. She has a history of migraines . She is currently on  Inderal LA 60 mg daily . This is been a good migraine preventive for her her blood pressure today 104/67.  She has one migraine per week for which she uses Relpax.  She has other  headaches related to stress.  Recent sleep study confirmed mild obstructive sleep apnea however she has stopped her CPAP and has just been fitted with a dental appliance. She recently had a visual aura prior to her headache. She has rarely had these in the past. She continues to have headaches around her menstrual cycle. Her triggers are weather changes hormones and sun light She recently had an IUD placed  She states the "Inderal  has changed my life ". She also has a history of bipolar disorder and sees psychiatrist and psychologist.  She sees 2 different counselors.  She takes melatonin for  insomnia with relief.  January 2017 CT of the brain was normal.  . She returns for reevaluation  HISTORY: Katelyn King is a 27 years old right-handed female,  accompanied by her husband, seen in refer by her primary care physician Dr. Antony Contras for evaluation of chronic migraine  She had a history of bipolar type II, is taking lamotrigine 200 mg every night, Latuda 20 mg every night, She reported a history of migraine since elementary school, her typical migraine are lateralized severe pounding headache was associated light noise sensitivity, lasting 6-8 hours, she brought headache diary at today's visit, she has average 6 to 8 typical migraines each month, moderate to severe, she has been taking over-the-counter Tylenol, sometimes with caffeinated drink to help her headache, occasionally her migraines preceded by visual distortion. Trigger for her headache a sleep deprivation, weather change, stress, Laboratory in 2016, normal glucose 79, LDL 72, normal CBC, hemoglobin 13.8, normal ferritin 70 7.9, TSH 3.79 I have personally reviewed CAT scan of the brain without contrast January 3rd 2016, that was within normal limit    REVIEW OF SYSTEMS: Full 14 system review of systems performed and notable only for those listed, all others are neg:  Constitutional: neg  Cardiovascular: neg Ear/Nose/Throat: neg  Skin: neg Eyes: neg Respiratory: neg Gastroitestinal: neg  Hematology/Lymphatic: neg  Endocrine: Intolerance to heat and cold Musculoskeletal:neg Allergy/Immunology:  Environmental allergies Neurological migraine headaches Psychiatric: Depression treated by psychiatry and psychologist Sleep : Obstructive sleep apnea , uses a dental appliance   ALLERGIES: Allergies  Allergen Reactions  . Topiramate Other (See Comments)    suicidal thoughts  . Lexapro [Escitalopram Oxalate]  Worsened depression.  Santiago Bur [Nitrofurantoin Macrocrystal] Rash    HOME MEDICATIONS: Outpatient Medications Prior to Visit  Medication Sig Dispense Refill  . busPIRone (BUSPAR) 10 MG tablet Take 10 mg by mouth 2 (two) times daily.    . fluticasone (FLONASE)  50 MCG/ACT nasal spray Place into both nostrils at bedtime.    . lamoTRIgine (LAMICTAL) 150 MG tablet Take 300 mg by mouth at bedtime.    Marland Kitchen levonorgestrel (MIRENA) 20 MCG/24HR IUD 1 each by Intrauterine route once.    . loratadine (CLARITIN) 10 MG tablet Take 10 mg by mouth daily.    Marland Kitchen lurasidone (LATUDA) 40 MG TABS tablet Take 40 mg by mouth daily with breakfast.    . Melatonin 3 MG TABS Take 3 mg by mouth at bedtime.    Marland Kitchen PAZEO 0.7 % SOLN INT 1 GTT IN OU QD PRN  5  . Probiotic Product (PROBIOTIC PO) Take 1 capsule by mouth daily.    . propranolol ER (INDERAL LA) 60 MG 24 hr capsule TAKE 1 CAPSULE(60 MG) BY MOUTH DAILY 30 capsule 12  . Riboflavin 100 MG CAPS Take 1 capsule by mouth daily.    Marland Kitchen Specialty Vitamins Products (MAGNESIUM, AMINO ACID CHELATE,) 133 MG tablet Take 1 tablet by mouth 2 (two) times daily.    . Vitamin D, Ergocalciferol, (DRISDOL) 50000 units CAPS capsule Take 50,000 Units by mouth every 7 (seven) days. Takes on Wednesday.    . eletriptan (RELPAX) 20 MG tablet Take 1 tablet (20 mg total) by mouth as needed for migraine or headache. May repeat in 2 hours if headache persists or recurs. 10 tablet 4   No facility-administered medications prior to visit.     PAST MEDICAL HISTORY: Past Medical History:  Diagnosis Date  . Anxiety   . Bipolar 2 disorder ([place])   . Family history of breast cancer   . Genetic testing 03/18/2017   Multi-Cancer panel (83 genes) @ [facility] - No pathogenic mutations detected  . Migraine   . Mood disorder ([place])   . OSA (obstructive sleep apnea)    uses dental device    PAST SURGICAL HISTORY: Past Surgical History:  Procedure Laterality Date  . LAPAROSCOPIC OOPHERECTOMY    . WISDOM TOOTH EXTRACTION      FAMILY HISTORY: Family History  Problem Relation Age of Onset  . Breast cancer Mother 34       BSO 34; Neg genetic testing (17 genes [facility] 2016)  . Breast cancer Paternal Grandmother 14       deceased 74s  . Hypercholesterolemia  Paternal Grandmother   . Hypertension Paternal Grandfather   . Hypercholesterolemia Maternal Grandmother   . Thyroid cancer Maternal Grandmother 39       currently 82  . Heart disease Maternal Grandfather   . Ovarian cancer Maternal Aunt 72       currently 59  . Hypothyroidism Sister        in 1 sister  . Polycystic ovary syndrome Sister     SOCIAL HISTORY: Social History   Socioeconomic History  . Marital status: Married    Spouse name: Not on file  . Number of children: 0  . Years of education: Not on file  . Highest education level: Not on file  Occupational History  . Occupation: Ship broker    Comment: Investment banker, corporate  Social Needs  . Financial resource strain: Not on file  . Food insecurity:    Worry: Not on file    Inability: Not  on file  . Transportation needs:    Medical: Not on file    Non-medical: Not on file  Tobacco Use  . Smoking status: Never Smoker  . Smokeless tobacco: Never Used  Substance and Sexual Activity  . Alcohol use: No    Alcohol/week: 0.0 oz  . Drug use: No  . Sexual activity: Yes    Partners: Male  Lifestyle  . Physical activity:    Days per week: Not on file    Minutes per session: Not on file  . Stress: Not on file  Relationships  . Social connections:    Talks on phone: Not on file    Gets together: Not on file    Attends religious service: Not on file    Active member of club or organization: Not on file    Attends meetings of clubs or organizations: Not on file    Relationship status: Not on file  . Intimate partner violence:    Fear of current or ex partner: Not on file    Emotionally abused: Not on file    Physically abused: Not on file    Forced sexual activity: Not on file  Other Topics Concern  . Not on file  Social History Narrative   Lives at home with husband.   Right-handed.   No caffeine use.     PHYSICAL EXAM  Vitals:   09/17/17 0932  BP: 110/72  Pulse: 79  Weight: 160 lb 0.2 oz (72.6 kg)    Height: 5\' 10"  (1.778 m)   Body mass index is 22.96 kg/m.  Generalized: Well developed, in no acute distress  Head: normocephalic and atraumatic,. Oropharynx benign  Neck: Supple, Musculoskeletal: No deformity   Neurological examination   Mentation: Alert oriented to time, place, history taking. Attention span and concentration appropriate. Recent and remote memory intact.  Follows all commands speech and language fluent. ESS 3 Cranial nerve II-XII: Pupils were equal round reactive to light extraocular movements were full, visual field were full on confrontational test. Facial sensation and strength were normal. hearing was intact to finger rubbing bilaterally. Uvula tongue midline. head turning and shoulder shrug were normal and symmetric.Tongue protrusion into cheek strength was normal. Motor: normal bulk and tone, full strength in the BUE, BLE, fine finger movements normal, no pronator drift. No focal weakness Sensory: normal and symmetric to light touch, In the upper and lower extremities Coordination  finger-nose-finger, heel-to-shin bilaterally, no dysmetria, no tremor Reflexes: Brachioradialis 2/2, biceps 2/2, triceps 2/2, patellar 2/2, Achilles 2/2, plantar responses were flexor bilaterally. Gait and Station: Rising up from seated position without assistance, normal stance,  moderate stride, good arm swing, smooth turning, able to perform tiptoe, and heel walking without difficulty. Tandem gait is steady  DIAGNOSTIC DATA (LABS, IMAGING, TESTING) -ASSESSMENT AND PLAN  27 y.o. year old female  has a past medical history of Anxiety, Bipolar 2 disorder ([place]), Family history of breast cancer, Genetic testing (03/18/2017), Migraine, Mood disorder ([place]), and OSA (obstructive sleep apnea). here to follow-up for  Migraines.  Last migraine was 4 weeks ago  She is currently on Inderal 60 mg daily.  She has failed Maxalt and Relpax and does not want any acute medication at this time       PLAN: Continue Inderal LA 60mg   mg daily  Discontinue Relpax 20 mg  Call for worsening of headaches Migraine tracker APP to keep record of headaches  F/U in 8 months  I spent 20 min in total face  to face time with the patient more than 50% of which was spent counseling and coordination of care, reviewing test results reviewing medications and discussing and reviewing the diagnosis of migraine and further treatment options . Sleep study confirmed obstructive sleep apnea.  She stopped CPAP but now has a dental appliance.Try to avoid migraine triggers.  Avoid over-the-counter medications.  Discussed adding another triptan but patient does not wish to do so at this time Keep regular follow-up appointments with psychiatry and psychologist Dennie Bible, [facility], [facility], [facility] [address] [place], [place] [address] [phone]  I reviewed the above note and documentation by the Nurse Practitioner and agree with the history, physical exam, assessment and plan as outlined above. I was immediately available for face-to-face consultation. [name], MD, PhD [facility])

## 2017-09-17 ENCOUNTER — Encounter: Payer: Self-pay | Admitting: Nurse Practitioner

## 2017-09-17 ENCOUNTER — Encounter (INDEPENDENT_AMBULATORY_CARE_PROVIDER_SITE_OTHER): Payer: Self-pay

## 2017-09-17 ENCOUNTER — Ambulatory Visit (INDEPENDENT_AMBULATORY_CARE_PROVIDER_SITE_OTHER): Payer: BLUE CROSS/BLUE SHIELD | Admitting: Nurse Practitioner

## 2017-09-17 VITALS — BP 110/72 | HR 79 | Ht 70.0 in | Wt 160.0 lb

## 2017-09-17 DIAGNOSIS — G4733 Obstructive sleep apnea (adult) (pediatric): Secondary | ICD-10-CM | POA: Diagnosis not present

## 2017-09-17 DIAGNOSIS — G43109 Migraine with aura, not intractable, without status migrainosus: Secondary | ICD-10-CM | POA: Diagnosis not present

## 2017-09-17 NOTE — Patient Instructions (Signed)
Continue Inderal LA 60mg   mg daily  Discontinue Relpax 20 mg  Call for worsening of headaches Migraine tracker APP to keep record of headaches  F/U in 8 months

## 2018-02-22 DIAGNOSIS — L720 Epidermal cyst: Secondary | ICD-10-CM | POA: Diagnosis not present

## 2018-03-17 ENCOUNTER — Other Ambulatory Visit: Payer: Self-pay | Admitting: Nurse Practitioner

## 2018-03-28 ENCOUNTER — Other Ambulatory Visit: Payer: Self-pay | Admitting: Nurse Practitioner

## 2018-04-28 ENCOUNTER — Telehealth: Payer: Self-pay | Admitting: *Deleted

## 2018-04-28 MED ORDER — PROPRANOLOL HCL ER 60 MG PO CP24: ORAL_CAPSULE | ORAL | 0 refills | Status: DC

## 2018-04-28 NOTE — Telephone Encounter (Signed)
Propranolol refilled x 1 month. Patient has FU later this month.

## 2018-05-04 DIAGNOSIS — Z30431 Encounter for routine checking of intrauterine contraceptive device: Secondary | ICD-10-CM | POA: Diagnosis not present

## 2018-05-04 DIAGNOSIS — R102 Pelvic and perineal pain: Secondary | ICD-10-CM | POA: Diagnosis not present

## 2018-05-04 DIAGNOSIS — R194 Change in bowel habit: Secondary | ICD-10-CM | POA: Diagnosis not present

## 2018-05-09 DIAGNOSIS — R102 Pelvic and perineal pain: Secondary | ICD-10-CM | POA: Diagnosis not present

## 2018-05-12 ENCOUNTER — Ambulatory Visit: Payer: BLUE CROSS/BLUE SHIELD | Admitting: Nurse Practitioner

## 2018-05-26 ENCOUNTER — Other Ambulatory Visit: Payer: Self-pay | Admitting: Neurology

## 2018-05-31 DIAGNOSIS — Z13220 Encounter for screening for lipoid disorders: Secondary | ICD-10-CM | POA: Diagnosis not present

## 2018-05-31 DIAGNOSIS — Z1329 Encounter for screening for other suspected endocrine disorder: Secondary | ICD-10-CM | POA: Diagnosis not present

## 2018-05-31 DIAGNOSIS — Z131 Encounter for screening for diabetes mellitus: Secondary | ICD-10-CM | POA: Diagnosis not present

## 2018-06-25 HISTORY — PX: UPPER GI ENDOSCOPY: SHX6162

## 2018-06-28 ENCOUNTER — Other Ambulatory Visit: Payer: Self-pay | Admitting: Nurse Practitioner

## 2018-06-28 MED ORDER — PROPRANOLOL HCL ER 60 MG PO CP24: ORAL_CAPSULE | ORAL | 1 refills | Status: DC

## 2018-06-28 NOTE — Telephone Encounter (Signed)
Refilled x 2 months until her FU in March. Note to pharmacy: keep FU in March.

## 2018-06-28 NOTE — Addendum Note (Signed)
Addended by: Minna Antis on: 06/28/2018 02:30 PM   Modules accepted: Orders

## 2018-06-28 NOTE — Telephone Encounter (Signed)
Pt has called for a refill on her propranolol ER (INDERAL LA) 60 MG 24 hr capsule  [facility] [phone]

## 2018-06-29 DIAGNOSIS — R110 Nausea: Secondary | ICD-10-CM | POA: Diagnosis not present

## 2018-06-29 DIAGNOSIS — R140 Abdominal distension (gaseous): Secondary | ICD-10-CM | POA: Diagnosis not present

## 2018-06-29 DIAGNOSIS — R1084 Generalized abdominal pain: Secondary | ICD-10-CM | POA: Diagnosis not present

## 2018-07-01 DIAGNOSIS — R1084 Generalized abdominal pain: Secondary | ICD-10-CM | POA: Diagnosis not present

## 2018-07-01 DIAGNOSIS — R110 Nausea: Secondary | ICD-10-CM | POA: Diagnosis not present

## 2018-07-01 DIAGNOSIS — K2930 Chronic superficial gastritis without bleeding: Secondary | ICD-10-CM | POA: Diagnosis not present

## 2018-07-01 DIAGNOSIS — Z01411 Encounter for gynecological examination (general) (routine) with abnormal findings: Secondary | ICD-10-CM | POA: Diagnosis not present

## 2018-07-10 DIAGNOSIS — S61201A Unspecified open wound of left index finger without damage to nail, initial encounter: Secondary | ICD-10-CM | POA: Diagnosis not present

## 2018-08-04 ENCOUNTER — Telehealth: Payer: Self-pay | Admitting: Nurse Practitioner

## 2018-08-04 NOTE — Telephone Encounter (Signed)
FYI Pt called to reschedule her 08-10-2018 appointment because she just got back from [facility] and stated due to their being a lot of elderly patients that come here she wanted to r/s her appointment for the following week.  Pt accepted an appointment for 03-25 with NP Sarah.  FYI no call back requested.

## 2018-08-04 NOTE — Telephone Encounter (Signed)
Noted  

## 2018-08-10 ENCOUNTER — Ambulatory Visit: Payer: Self-pay | Admitting: Nurse Practitioner

## 2018-08-16 ENCOUNTER — Telehealth: Payer: Self-pay

## 2018-08-16 NOTE — Telephone Encounter (Signed)
Unable to get in contact with the patient. I left a voicemail letting her know that her appointment with Judson Roch has been cancelled. But I did offer her a telephone visit at her appointment time but she must call us back and consent. Office number was provided.

## 2018-08-17 ENCOUNTER — Other Ambulatory Visit: Payer: Self-pay

## 2018-08-17 ENCOUNTER — Encounter: Payer: Self-pay | Admitting: Neurology

## 2018-08-17 ENCOUNTER — Ambulatory Visit (INDEPENDENT_AMBULATORY_CARE_PROVIDER_SITE_OTHER): Payer: 59 | Admitting: Neurology

## 2018-08-17 DIAGNOSIS — G43009 Migraine without aura, not intractable, without status migrainosus: Secondary | ICD-10-CM | POA: Diagnosis not present

## 2018-08-17 MED ORDER — PROPRANOLOL HCL ER 60 MG PO CP24: ORAL_CAPSULE | ORAL | 6 refills | Status: DC

## 2018-08-17 NOTE — Progress Notes (Signed)
Virtual Visit via Video Note  I connected with Katelyn King on 08/17/18 at  8:45 AM EDT by a video enabled telemedicine application and verified that I am speaking with the correct person using two identifiers.   I discussed the limitations of evaluation and management by telemedicine and the availability of in person appointments. The patient expressed understanding and agreed to proceed.  History of Present Illness: August 17, 2018 SS: Visit via Webex; 28 yo female with history of migraines. She is currently taking propanolol LA 60 mg daily. In the past she has failed Maxalt and Relpax, fiorcet. She was initially evaluated by Dr. Krista Blue in 2017 for migraine, she has history of h/a since elementary school. She has history of bipolar disorder, taking lamotrigine 300 mg at bedtime, Latuda. She has close follow-up with her psychiatrist.  She was recently started on prazosin for nightmares.  She reports that her headaches have been doing well, but with recent stress with Covid-19 she has been having more headaches, has had 3 headaches this week.  Usually she would only have 4/month.  She reports when she does get a headache she will take Tylenol and/or ibuprofen and this does help with the pain.  She has further stressed because she is a Charity fundraiser at [employer] and her school is currently out for the virus, will delay her graduation next year.  She describes her typical migraine headache as occurring either on the right side or left side of her head, phonophobia, dizziness, nausea, irritability.  In the past she has tried Topamax but was unable to tolerate.  She denies any changes to her medical history.  4/26/2019CM Katelyn King, 28 year old female returns for follow-up with history of migraines and mild obstructive sleep apnea.  She is currently on propanolol LA 60 mg daily with good control of her headaches.  She has had one bad migraine in the last 4 weeks.  She has failed Maxalt and Relpax in the  past and really does not want to try another acute medication since her headaches are in good control.  Her migraine triggers are weather changes in hormones and sunlight.  She wears migraine glasses.  She is using a appliance for her obstructive sleep apnea with good results.  She also has a history of bipolar disorder and sees psychiatry and psychologist.  She returns for reevaluation.  05/19/17 CM Katelyn King, 28 year old female returns for follow-up. She has a history of migraines . She is currently on  Inderal LA 60 mg daily . This is been a good migraine preventive for her her blood pressure today 104/67.  She has one migraine per week for which she uses Relpax.  She has other  headaches related to stress.  Recent sleep study confirmed mild obstructive sleep apnea however she has stopped her CPAP and has just been fitted with a dental appliance. She recently had a visual aura prior to her headache. She has rarely had these in the past. She continues to have headaches around her menstrual cycle. Her triggers are weather changes hormones and sun light She recently had an IUD placed  She states the "Inderal  has changed my life ". She also has a history of bipolar disorder and sees psychiatrist and psychologist.  She sees 2 different counselors.  She takes melatonin for  insomnia with relief.  January 2017 CT of the brain was normal.  . She returns for reevaluation  HISTORY: Katelyn King is a 28 years old right-handed female, accompanied  by her husband, seen in refer by her primary care physician Dr. Antony Contras for evaluation of chronic migraine  She had a history of bipolar type II, is taking lamotrigine 200 mg every night, Latuda 20 mg every night, She reported a history of migraine since elementary school, her typical migraine are lateralized severe pounding headache was associated light noise sensitivity, lasting 6-8 hours, she brought headache diary at today's visit, she has average 6 to 8  typical migraines each month, moderate to severe, she has been taking over-the-counter Tylenol, sometimes with caffeinated drink to help her headache, occasionally her migraines preceded by visual distortion. Trigger for her headache a sleep deprivation, weather change, stress, Laboratory in 2016, normal glucose 79, LDL 72, normal CBC, hemoglobin 13.8, normal ferritin 70 7.9, TSH 3.79 I have personally reviewed CAT scan of the brain without contrast January 3rd 2016, that was within normal limit    Observations/Objective: Alert, well-appearing, appropriate verbal response and tone.  Follows commands, facial symmetry noted, no arm drift, appropriately performs finger-to-nose.  Gait is intact, no drift or unsteadiness noted.  Assessment and Plan: 1.  Migraine headaches  She will continue taking Inderal LA 60 mg daily. A refill was sent to her pharmacy. She reports due to recent stress she has had an increase in her migraine headaches.  She does not wish to make any changes to her medication regimen at this time.  We could potentially increase the Inderal LA, however would be best to check blood pressure/heart rate before making a change.  She could not tolerate Topamax in the past.  She is currently on lamotrigine, Latuda, recently started on prazosin for nightmares from psychiatrist. In the past she hasn't wanted any medications for acute treatment.   Follow Up Instructions: She will follow-up in 6 months or sooner if needed.    I discussed the assessment and treatment plan with the patient. The patient was provided an opportunity to ask questions and all were answered. The patient agreed with the plan and demonstrated an understanding of the instructions.   The patient was advised to call back or seek an in-person evaluation if the symptoms worsen or if the condition fails to improve as anticipated.  I spent 15 minutes of non-face to face time with the patient.   Evangeline Dakin, DNP   [facility] [address] [place], [place] [address] [phone]

## 2018-08-25 NOTE — Progress Notes (Signed)
I have reviewed and agreed above plan. 

## 2018-10-04 ENCOUNTER — Telehealth: Payer: Self-pay

## 2018-10-04 NOTE — Telephone Encounter (Signed)
Judson Roch, NP recommended a follow up in September 2020 with her. I called pt to schedule this, no answer, left a message asking her to call me back. If pt calls back, please assist her with this.

## 2019-03-23 ENCOUNTER — Other Ambulatory Visit: Payer: Self-pay | Admitting: *Deleted

## 2019-03-23 MED ORDER — PROPRANOLOL HCL ER 60 MG PO CP24: ORAL_CAPSULE | ORAL | 6 refills | Status: DC

## 2019-06-21 ENCOUNTER — Encounter: Payer: Self-pay | Admitting: Neurology

## 2019-06-21 ENCOUNTER — Telehealth (INDEPENDENT_AMBULATORY_CARE_PROVIDER_SITE_OTHER): Payer: 59 | Admitting: Neurology

## 2019-06-21 DIAGNOSIS — G4489 Other headache syndrome: Secondary | ICD-10-CM | POA: Diagnosis not present

## 2019-06-21 DIAGNOSIS — G43009 Migraine without aura, not intractable, without status migrainosus: Secondary | ICD-10-CM

## 2019-06-21 MED ORDER — DICLOFENAC POTASSIUM 50 MG PO TABS: 50.0000 mg | ORAL_TABLET | Freq: Two times a day (BID) | ORAL | 1 refills | Status: DC | PRN

## 2019-06-21 NOTE — Progress Notes (Signed)
Virtual Visit via Video Note  I connected with Katelyn King on 06/21/19 at  1:45 PM EST by a video enabled telemedicine application and verified that I am speaking with the correct person using two identifiers.  Location: Patient: at home Provider: in the office   I discussed the limitations of evaluation and management by telemedicine and the availability of in person appointments. The patient expressed understanding and agreed to proceed.  History of Present Illness: 06/11/2015 Dr Krista Blue: Katelyn King is a 29 years old right-handed female, accompanied by her husband, seen in refer by her primary care physician Dr. Antony Contras for evaluation of chronic migraine  She had a history of bipolar type II, is taking lamotrigine 200 mg every night, Latuda 20 mg every night,  She reported a history of migraine since elementary school, her typical migraine are lateralized severe pounding headache was associated light noise sensitivity, lasting 6-8 hours, she brought headache diary at today's visit, she has average 6 to 8 typical migraines each month, moderate to severe, she has been taking over-the-counter Tylenol, sometimes with caffeinated drink to help her headache, occasionally her migraines preceded by visual distortion.  Trigger for her headache a sleep deprivation, weather change, stress,  Laboratory in 2016, normal glucose 79, LDL 72, normal CBC, hemoglobin 13.8, normal ferritin 70 7.9, TSH 3.79  I have personally reviewed CAT scan of the brain without contrast January 3rd 2016, that was within normal limit   05/19/17 CMMs. Katelyn King, 29 year old female returns for follow-up. She has a history of migraines . She is currently on Inderal LA 60 mg daily . This is been a good migraine preventive for her her blood pressure today 104/67. She has one migraine per week for which she uses Relpax. She has other headaches related to stress. Recent sleep study confirmed mild obstructive sleep  apnea however she has stopped her CPAP and has just been fitted with a dental appliance. She recently had a visual aura prior to her headache. She has rarely had these in the past. She continues to have headaches around her menstrual cycle. Her triggers are weather changes hormones and sun light She recently had an IUD placed She states the "Inderal has changed my life ". She also has a history of bipolar disorder and sees psychiatrist and psychologist. She sees 2 different counselors. She takes melatonin for insomnia with relief. January 2017 CT of the brain was normal. . She returns for reevaluation 4/26/2019CMMs. Katelyn King, 29 year old female returns for follow-up with history of migraines and mild obstructive sleep apnea. She is currently on propanolol LA 60 mg daily with good control of her headaches. She has had one bad migraine in the last 4 weeks. She has failed Maxalt and Relpax in the past and really does not want to try another acute medication since her headaches are in good control. Her migraine triggers are weather changes in hormones and sunlight. She wears migraine glasses. She is using a appliance for her obstructive sleep apnea with good results. She also has a history of bipolar disorder and sees psychiatry and psychologist. She returns for reevaluation.  August 17, 2018 SS: Visit via Webex; 29 yo female with history of migraines. She is currently taking propanolol LA 60 mg daily. In the past she has failed Maxalt and Relpax, fiorcet. She was initially evaluated by Dr. Krista Blue in 2017 for migraine, she has history of h/a since elementary school. She has history of bipolar disorder, taking lamotrigine 300 mg at bedtime, Latuda.  She has close follow-up with her psychiatrist.  She was recently started on prazosin for nightmares.  She reports that her headaches have been doing well, but with recent stress with Covid-19 she has been having more headaches, has had 3 headaches this week.   Usually she would only have 4/month.  She reports when she does get a headache she will take Tylenol and/or ibuprofen and this does help with the pain.  She has further stressed because she is a Charity fundraiser at [employer] and her school is currently out for the virus, will delay her graduation next year.  She describes her typical migraine headache as occurring either on the right side or left side of her head, phonophobia, dizziness, nausea, irritability.  In the past she has tried Topamax but was unable to tolerate.  She denies any changes to her medical history.  Update June 21 2019 SS: Katelyn King presents via virtual visit for evaluation of migraine headache, since last seen in March, she reports her headaches have been doing well 1-2 per month, but had different kind of headache last week, 4 days, and 2 days this week.  She had increased stress last week, started school, she had an anxiety attack, developed spots in her vision, had headache to left side, was sharp pain, intermittently says she lost hearing in her left ear, had blurry vision, altered sensation to her left mid face, she went to urgent care, was tested for Covid, was negative, has been taking ibuprofen.  This week has headache on the right side, not as intense as last week.  Is concerned that her headache pattern is changed, no slurred speech, gait change, or numbness or weakness in her arms or legs.  She remains under the care of a psychiatrist, is prescribed Seroquel, Lamictal, BuSpar, prazosin.  She remains on Inderal LA 60 mg daily for migraine prevention.  Her blood pressure remains on the lower end 110/75, heart rate 63. She now feels weak, run down from the headaches.  She has not been able to tolerate Maxalt or Relpax previously, she has been treated with prednisone, said it made her not sleep.    Observations/Objective: Via virtual visit, is alert and oriented, sitting in office chair, good historian, smiling,  participatory, engaging, facial symmetry noted, speech is clear and concise, follows commands, answers questions appropriately, symmetric movement of the eyes, symmetric shoulder shrug, no arm drift, gait is intact, steady  Assessment and Plan: 1.  Chronic migraine headache  She describes a new type of headache, and presentation started last week, associated with seeing spots, dizziness, intermittent loss of hearing to her left ear, altered sensation to her left mid face, sharp pain to the left side of her head.  This is very worrisome for her, no other neurological symptoms.  The onset of headache was associated with increase in stress, anxiety with starting school.  Given the new kind of headache, and worry, I will order MRI of the brain to rule out abnormality.  She will continue taking Inderal LA 60 mg daily, I did not increase medication, as her heart rate and blood pressure has been on the lower end, and she does not think she can tolerate an increase.  I am not inclined to offer her an antidepressant, as she is already taking BuSpar, Seroquel, Lamictal, Latuda and prazosin.  She has been unable to tolerate Topamax.  Since March, her headaches have been under good control, only 1-2 headaches a month.  If her  headache pattern increases, we should consider CGRP medication.  She does not prefer triptans, has failed Maxalt and Relpax.  I will offer diclofenac potassium tablet for acute headache, I offered prednisone taper to break the cycle, but she has not done well with prednisone in the past.  If her headaches do not improve, or she has change in pattern, she is to contact our office, I have made her revisit for 3 months in the office.   Follow Up Instructions: 3 months 09/21/2019 2:45   I discussed the assessment and treatment plan with the patient. The patient was provided an opportunity to ask questions and all were answered. The patient agreed with the plan and demonstrated an understanding of the  instructions.   The patient was advised to call back or seek an in-person evaluation if the symptoms worsen or if the condition fails to improve as anticipated.  I provided 15 minutes of non-face-to-face time during this encounter.  Evangeline Dakin, DNP  [facility] [address] [place], [place] [address] [phone]

## 2019-07-03 ENCOUNTER — Encounter: Payer: Self-pay | Admitting: Neurology

## 2019-07-04 ENCOUNTER — Ambulatory Visit: Payer: 59

## 2019-07-04 ENCOUNTER — Other Ambulatory Visit: Payer: Self-pay

## 2019-07-04 DIAGNOSIS — G4489 Other headache syndrome: Secondary | ICD-10-CM | POA: Diagnosis not present

## 2019-07-06 ENCOUNTER — Telehealth: Payer: Self-pay

## 2019-07-06 NOTE — Telephone Encounter (Signed)
Called and was able to speak with the pt directly, Went over pts recent MRI results per NP Butler Denmark. Pt demonstrated  understanding and had no questions.   "Please call patient. MRI of the brain was unremarkable. " -Butler Denmark NP

## 2019-07-26 NOTE — Progress Notes (Signed)
I have reviewed and agreed above plan. 

## 2019-08-24 ENCOUNTER — Ambulatory Visit: Payer: 59 | Attending: Internal Medicine

## 2019-08-24 DIAGNOSIS — Z23 Encounter for immunization: Secondary | ICD-10-CM

## 2019-08-24 NOTE — Progress Notes (Signed)
   Covid-19 Vaccination Clinic  Name:  Katelyn King    MRN: [id number] DOB: May 05, 1991  08/24/2019  [name] was observed post Covid-19 immunization for 15 minutes without incident. She was provided with Vaccine Information Sheet and instruction to access the V-Safe system.   [name] was instructed to call 911 with any severe reactions post vaccine: Marland Kitchen Difficulty breathing  . Swelling of face and throat  . A fast heartbeat  . A bad rash all over body  . Dizziness and weakness   Immunizations Administered    Name Date Dose VIS Date Route   Pfizer COVID-19 Vaccine 08/24/2019  1:30 PM 0.3 mL 05/05/2019 Intramuscular   Manufacturer: [place]   Lot: [id number]   [place]: [id number]

## 2019-08-31 ENCOUNTER — Encounter: Payer: Self-pay | Admitting: Nurse Practitioner

## 2019-09-13 ENCOUNTER — Ambulatory Visit: Payer: 59 | Admitting: Nurse Practitioner

## 2019-09-13 ENCOUNTER — Other Ambulatory Visit (INDEPENDENT_AMBULATORY_CARE_PROVIDER_SITE_OTHER): Payer: 59

## 2019-09-13 ENCOUNTER — Encounter: Payer: Self-pay | Admitting: Nurse Practitioner

## 2019-09-13 VITALS — BP 92/60 | Temp 98.5°F | Ht 70.0 in | Wt 165.0 lb

## 2019-09-13 DIAGNOSIS — R1013 Epigastric pain: Secondary | ICD-10-CM | POA: Diagnosis not present

## 2019-09-13 DIAGNOSIS — E739 Lactose intolerance, unspecified: Secondary | ICD-10-CM

## 2019-09-13 DIAGNOSIS — K9049 Malabsorption due to intolerance, not elsewhere classified: Secondary | ICD-10-CM

## 2019-09-13 LAB — COMPREHENSIVE METABOLIC PANEL
ALT: 12 U/L (ref 0–35)
AST: 14 U/L (ref 0–37)
Albumin: 4.9 g/dL (ref 3.5–5.2)
Alkaline Phosphatase: 83 U/L (ref 39–117)
BUN: 7 mg/dL (ref 6–23)
CO2: 29 mEq/L (ref 19–32)
Calcium: 9.7 mg/dL (ref 8.4–10.5)
Chloride: 104 mEq/L (ref 96–112)
Creatinine, Ser: 0.81 mg/dL (ref 0.40–1.20)
GFR: 82.96 mL/min (ref >60.00)
Glucose, Bld: 89 mg/dL (ref 70–99)
Potassium: 4.1 mEq/L (ref 3.5–5.1)
Sodium: 138 mEq/L (ref 135–145)
Total Bilirubin: 0.7 mg/dL (ref 0.2–1.2)
Total Protein: 7.5 g/dL (ref 6.0–8.3)

## 2019-09-13 NOTE — Progress Notes (Addendum)
Attending Physician's Attestation   I have reviewed the chart.   I agree with the Advanced Practitioner's note, impression, and recommendations with any updates as below.  Consider TSH evaluation and Cortisol evaluation if not previously completed as well.   Justice Britain, MD [facility] # [phone]

## 2019-09-13 NOTE — Progress Notes (Addendum)
09/13/2019 Katelyn King [id number] [birth year]   CHIEF COMPLAINT: Food intolerance   HISTORY OF PRESENT ILLNESS:  Katelyn King is a 29 year old female with a past medical history of anxiety, bipolar disorder, PTSD, migraine headaches and OSA uses dental appliance.  Her BP is typically 90/60's.  Past left oophorectomy. She presents today for second opinion regarding the new development of specific food intolerances.  Over the past 4 to 6 months, she is unable to tolerate tomatoes, potatoes or peppers.  These food products cause stomach cramps.  She has a history of lactose intolerance.  Dairy products resulted in foul diarrhea.  Garlic, onions and goat milk also trigger diarrhea.  She denies having any GERD symptoms.  No recent nausea.  She reported having nausea in 2018 which resolved after taking Claritin for allergies.  She has infrequent burping which is bothersome to her.  She is passing a normal brown bowel movement most days but she has occasional loose stools.  No rectal bleeding or melena.  She reports losing approximately 5 pounds over the past few months.  She was previously evaluated by [facility].  She underwent an EGD 05/2018 which she reported was normal.  No evidence of Helicobacter pylori or celiac disease.  She stated a medication for GERD was recommended but she declined as she did not assess she had GERD symptoms.  Her newest medication is Seroquel which was started in June or July 2020 during an increased time of stress related to her divorce process. Her stress level since that time has improved.  No family history of celiac disease, IBD or colorectal cancer.  No other complaints today.  Past Medical History:  Diagnosis Date  . Anxiety   . Bipolar 2 disorder ([place])   . Family history of breast cancer   . Genetic testing 03/18/2017   Multi-Cancer panel (83 genes) @ [facility] - No pathogenic mutations detected  . Migraine   . Mood disorder ([place])   . OSA (obstructive sleep apnea)      uses dental device   Past Surgical History:  Procedure Laterality Date  . LAPAROSCOPIC OOPHERECTOMY    . WISDOM TOOTH EXTRACTION      Social History: She is divorced.  Nonsmoker. No alcohol or drug use.  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   Family History: Mother age 52 with history of breast cancer and colon polyps. Paternal grandmother breast cancer. Maternal aunt with ovarian cancer. Maternal grandmother with history of thyroid cancer.    Allergies  Allergen Reactions  . Topiramate Other (See Comments)    suicidal thoughts  . Lexapro [Escitalopram Oxalate]     Worsened depression.  Santiago Bur [Nitrofurantoin Macrocrystal] Rash      Outpatient Encounter Medications as of 09/13/2019  Medication Sig  . b complex vitamins tablet Take 1 tablet by mouth daily.  . busPIRone (BUSPAR) 10 MG tablet Take 10 mg by mouth 2 (two) times daily.  . diclofenac (CATAFLAM) 50 MG tablet Take 1 tablet (50 mg total) by mouth 2 (two) times daily as needed.  . lamoTRIgine (LAMICTAL) 150 MG tablet Take 300 mg by mouth at bedtime.  Marland Kitchen levonorgestrel (MIRENA) 20 MCG/24HR IUD 1 each by Intrauterine route once.  . loratadine (CLARITIN) 10 MG tablet Take 10 mg by mouth daily.  Marland Kitchen lurasidone (LATUDA) 40 MG TABS tablet Take 40 mg by mouth daily with breakfast.  . PAZEO 0.7 % SOLN INT 1 GTT  IN OU QD PRN  . prazosin (MINIPRESS) 2 MG capsule Take 2 mg by mouth at bedtime.  . Probiotic Product (PROBIOTIC PO) Take 1 capsule by mouth daily.  . propranolol ER (INDERAL LA) 60 MG 24 hr capsule Take one daily  . QUEtiapine (SEROQUEL) 25 MG tablet Take 25 mg by mouth at bedtime.  . Vitamin D, Ergocalciferol, (DRISDOL) 50000 units CAPS capsule Take 50,000 Units by mouth every 7 (seven) days. Takes on Wednesday.  Marland Kitchen Specialty Vitamins Products (MAGNESIUM, AMINO ACID CHELATE,) 133 MG tablet Take 1 tablet by mouth 2 (two) times daily.  . [DISCONTINUED] fluticasone (FLONASE) 50  MCG/ACT nasal spray Place into both nostrils at bedtime.  . [DISCONTINUED] Melatonin 3 MG TABS Take 3 mg by mouth at bedtime.  . [DISCONTINUED] Riboflavin 100 MG CAPS Take 1 capsule by mouth daily.   No facility-administered encounter medications on file as of 09/13/2019.     REVIEW OF SYSTEMS: All other systems reviewed and negative except where noted in the History of Present Illness.   PHYSICAL EXAM: BP 92/60   Temp 98.5 F (36.9 C)   Ht 5\' 10"  (1.778 m)   Wt 165 lb (74.8 kg)   BMI 23.68 kg/m  General: Well developed 29 year old female in no acute distress. Head: Normocephalic and atraumatic. Eyes:  Sclerae non-icteric, conjunctive pink. Ears: Normal auditory acuity. Mouth: Dentition intact. No ulcers or lesions.  Neck: Supple, no lymphadenopathy or thyromegaly.  Lungs: Clear bilaterally to auscultation without wheezes, crackles or rhonchi. Heart: Regular rate and rhythm. No murmur, rub or gallop appreciated.  Abdomen: Soft, nondistended. Trace tenderness left of the umbilicus.  No masses. No hepatosplenomegaly. Normoactive bowel sounds x 4 quadrants.  Rectal: Deferred.  Musculoskeletal: Symmetrical with no gross deformities. Skin: Warm and dry. No rash or lesions on visible extremities. Extremities: No edema. Neurological: Alert oriented x 4, no focal deficits.  Psychological:  Alert and cooperative. Normal mood and affect.  ASSESSMENT AND PLAN:  13.  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 twice daily, try taking prior to eating tomatoes, potatoes or peppers -Patient declined trial with famotidine -Obtain records from [facility] -Further follow-up to be determined after the above lab results reviewed -Patient will call our office if her symptoms worsen  2. Lactose intolerant   ADDENDUM 09/22/2019: Records from [facility] received. 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     CC:  Antony Contras, MD

## 2019-09-13 NOTE — Patient Instructions (Addendum)
If you are age 30 or older, your body mass index should be between 23-30. Your Body mass index is 23.68 kg/m. If this is out of the aforementioned range listed, please consider follow up with your Primary Care Provider.  If you are age 74 or younger, your body mass index should be between 19-25. Your Body mass index is 23.68 kg/m. If this is out of the aformentioned range listed, please consider follow up with your Primary Care Provider.   Please purchase the following medications over the counter and take as directed:  FD Guard 2 capsules by mouth twice daily. Take this medication prior to eating tomatoes, potatoes, peppers to see if you tolerate. We have given you 4 days worth of samples.   Your provider has requested that you go to the basement level for lab work before leaving today. Press "B" on the elevator. The lab is located at the first door on the left as you exit the elevator.'  Due to recent changes in healthcare laws, you may see the results of your imaging and laboratory studies on MyChart before your provider has had a chance to review them.  We understand that in some cases there may be results that are confusing or concerning to you. Not all laboratory results come back in the same time frame and the provider may be waiting for multiple results in order to interpret others.  Please give [place] 48 hours in order for your provider to thoroughly review all the results before contacting the office for clarification of your results.

## 2019-09-14 LAB — FOOD ALLERGY PROFILE
Allergen, Salmon, f41: <0.1 kU/L
Almonds: <0.1 kU/L
CLASS: 0
CLASS: 0
CLASS: 0
CLASS: 0
CLASS: 0
CLASS: 0
CLASS: 0
CLASS: 0
CLASS: 0
CLASS: 0
CLASS: 0
Cashew IgE: <0.1 kU/L
Class: 0
Class: 0
Class: 0
Class: 0
Egg White IgE: <0.1 kU/L
Fish Cod: <0.1 kU/L
Hazelnut: <0.1 kU/L
Milk IgE: <0.1 kU/L
Peanut IgE: <0.1 kU/L
Scallop IgE: <0.1 kU/L
Sesame Seed f10: <0.1 kU/L
Shrimp IgE: <0.1 kU/L
Soybean IgE: <0.1 kU/L
Tuna IgE: <0.1 kU/L
Walnut: <0.1 kU/L
Wheat IgE: <0.1 kU/L

## 2019-09-14 LAB — CBC
HCT: 40.9 % (ref 36.0–46.0)
Hemoglobin: 13.8 g/dL (ref 12.0–15.0)
MCHC: 33.7 g/dL (ref 30.0–36.0)
MCV: 98.4 fl (ref 78.0–100.0)
Platelets: 250 10*3/uL (ref 150.0–400.0)
RBC: 4.16 Mil/uL (ref 3.87–5.11)
RDW: 11.8 % (ref 11.5–15.5)
WBC: 7.8 10*3/uL (ref 4.0–10.5)

## 2019-09-14 LAB — INTERPRETATION:

## 2019-09-18 ENCOUNTER — Ambulatory Visit: Payer: 59 | Attending: Internal Medicine

## 2019-09-18 DIAGNOSIS — Z23 Encounter for immunization: Secondary | ICD-10-CM

## 2019-09-18 NOTE — Progress Notes (Signed)
   Covid-19 Vaccination Clinic  Name:  Maripaz Glickstein    MRN: [id number] DOB: [birth year]  09/18/2019  Ms. Frontz was observed post Covid-19 immunization for 15 minutes without incident. She was provided with Vaccine Information Sheet and instruction to access the V-Safe system.   Ms. Suri was instructed to call 911 with any severe reactions post vaccine: Marland Kitchen Difficulty breathing  . Swelling of face and throat  . A fast heartbeat  . A bad rash all over body  . Dizziness and weakness   Immunizations Administered    Name Date Dose VIS Date Route   Pfizer COVID-19 Vaccine 09/18/2019  1:31 PM 0.3 mL 07/19/2018 Intramuscular   Manufacturer: [place]   Lot: [id number]   [place]: [id number]

## 2019-09-21 ENCOUNTER — Other Ambulatory Visit: Payer: Self-pay

## 2019-09-21 ENCOUNTER — Encounter: Payer: Self-pay | Admitting: Neurology

## 2019-09-21 ENCOUNTER — Ambulatory Visit: Payer: 59 | Admitting: Neurology

## 2019-09-21 VITALS — BP 100/67 | HR 69 | Temp 97.8°F | Ht 70.0 in | Wt 160.0 lb

## 2019-09-21 DIAGNOSIS — G43009 Migraine without aura, not intractable, without status migrainosus: Secondary | ICD-10-CM

## 2019-09-21 MED ORDER — AIMOVIG 70 MG/ML ~~LOC~~ SOAJ: 70.0000 mg | SUBCUTANEOUS | 11 refills | Status: DC

## 2019-09-21 MED ORDER — PROPRANOLOL HCL ER 60 MG PO CP24: ORAL_CAPSULE | ORAL | 6 refills | Status: DC

## 2019-09-21 NOTE — Progress Notes (Signed)
PATIENT: Katelyn King DOB: 1990/11/11  REASON FOR VISIT: follow up HISTORY FROM: patient  HISTORY OF PRESENT ILLNESS: Today 09/21/19  HISTORY  06/11/2015 Dr Krista Blue: Rosine Abe Smithis a 29 years old right-handed female, accompanied by her husband, seen in refer by her primary care physician Dr. Antony Contras for evaluation of chronic migraine  She had a history of bipolar type II, is taking lamotrigine 200 mg every night, Latuda 20 mg every night,  She reported a history of migraine since elementary school, her typical migraine are lateralized severe pounding headache was associated light noise sensitivity, lasting 6-8 hours, she brought headache diary at today's visit, she has average 6 to 8 typical migraines each month, moderate to severe, she has been taking over-the-counter Tylenol, sometimes with caffeinated drink to help her headache, occasionally her migraines preceded by visual distortion.  Trigger for her headache a sleep deprivation, weather change, stress,  Laboratory in 2016, normal glucose 79, LDL 72, normal CBC, hemoglobin 13.8, normal ferritin 70 7.9, TSH 3.79  I have personally reviewed CAT scan of the brain without contrast January 3rd 2016, that was within normal limit   05/19/17 CMMs. Shackett, 29 year old female returns for follow-up. She has a history of migraines . She is currently on Inderal LA 60 mg daily . This is been a good migraine preventive for her her blood pressure today 104/67. She has one migraine per week for which she uses Relpax. She has other headaches related to stress. Recent sleep study confirmed mild obstructive sleep apnea however she has stopped her CPAP and has just been fitted with a dental appliance. She recently had a visual aura prior to her headache. She has rarely had these in the past. She continues to have headaches around her menstrual cycle. Her triggers are weather changes hormones and sun light She recently had an IUD placed She  states the "Inderal has changed my life ". She also has a history of bipolar disorder and sees psychiatrist and psychologist. She sees 2 different counselors. She takes melatonin for insomnia with relief. January 2017 CT of the brain was normal. . She returns for reevaluation 4/26/2019CMMs. Hafele, 29 year old female returns for follow-up with history of migraines and mild obstructive sleep apnea. She is currently on propanolol LA 60 mg daily with good control of her headaches. She has had one bad migraine in the last 4 weeks. She has failed Maxalt and Relpax in the past and really does not want to try another acute medication since her headaches are in good control. Her migraine triggers are weather changes in hormones and sunlight. She wears migraine glasses. She is using a appliance for her obstructive sleep apnea with good results. She also has a history of bipolar disorder and sees psychiatry and psychologist. She returns for reevaluation.  August 17, 2018 SS: Visit via [facility]; 29 yo female with history of migraines. She is currently taking propanolol LA 60 mg daily. In the past she has failed Maxalt and Relpax, fiorcet. She was initially evaluated by Dr. Krista Blue in 2017 for migraine, she has history of h/a since elementary school. She has history of bipolar disorder, taking lamotrigine 300 mg at bedtime, Latuda. She hasclose follow-up with her psychiatrist.She was recently started on prazosinfor nightmares.She reports that her headaches have been doing well, but with recent stress with Covid-19she has been having more headaches, has had 3 headaches this week. Usually she would only have 4/month. She reports when she does get a headache she will take Tylenol  and/or ibuprofen and this does help with the pain. She has further stressed because she is a Charity fundraiser at [employer] and her school is currently out for the virus, will delay her graduation next year. She describes her  typical migraine headache as occurring either on the right side or left side of her head, phonophobia, dizziness, nausea, irritability. In the past she has tried Topamax but was unable to tolerate. She denies any changes to her medical history.  Update June 21 2019 SS: Ms. [name] presents via virtual visit for evaluation of migraine headache, since last seen in March, she reports her headaches have been doing well 1-2 per month, but had different kind of headache last week, 4 days, and 2 days this week.  She had increased stress last week, started school, she had an anxiety attack, developed spots in her vision, had headache to left side, was sharp pain, intermittently says she lost hearing in her left ear, had blurry vision, altered sensation to her left mid face, she went to urgent care, was tested for Covid, was negative, has been taking ibuprofen.  This week has headache on the right side, not as intense as last week.  Is concerned that her headache pattern is changed, no slurred speech, gait change, or numbness or weakness in her arms or legs.  She remains under the care of a psychiatrist, is prescribed Seroquel, Lamictal, BuSpar, prazosin.  She remains on Inderal LA 60 mg daily for migraine prevention.  Her blood pressure remains on the lower end 110/75, heart rate 63. She now feels weak, run down from the headaches.  She has not been able to tolerate Maxalt or Relpax previously, she has been treated with prednisone, said it made her not sleep.  Update September 21, 2019 SS: MRI of the brain in February 2021 was unremarkable.  Indicates her headaches have improved, she went to the eye doctor, was found to have an astigmatism, now has new glasses.  This is greatly helped, as she is doing a lot of work on the computer.  With headaches, reports 1-2 migraines a week.  She will take diclofenac with good benefit.  With headaches, she will see spots in her vision, before the headache.  REVIEW OF SYSTEMS: Out  of a complete 14 system review of symptoms, the patient complains only of the following symptoms, and all other reviewed systems are negative.  Headache   ALLERGIES: Allergies  Allergen Reactions  . Topiramate Other (See Comments)    suicidal thoughts  . Lexapro [Escitalopram Oxalate]     Worsened depression.  Santiago Bur [Nitrofurantoin Macrocrystal] Rash    HOME MEDICATIONS: Outpatient Medications Prior to Visit  Medication Sig Dispense Refill  . b complex vitamins tablet Take 1 tablet by mouth daily.    . busPIRone (BUSPAR) 10 MG tablet Take 10 mg by mouth 2 (two) times daily.    . diclofenac (CATAFLAM) 50 MG tablet Take 1 tablet (50 mg total) by mouth 2 (two) times daily as needed. 30 tablet 1  . lamoTRIgine (LAMICTAL) 150 MG tablet Take 300 mg by mouth at bedtime.    Marland Kitchen levonorgestrel (MIRENA) 20 MCG/24HR IUD 1 each by Intrauterine route once.    . loratadine (CLARITIN) 10 MG tablet Take 10 mg by mouth daily.    Marland Kitchen lurasidone (LATUDA) 40 MG TABS tablet Take 40 mg by mouth daily with breakfast.    . PAZEO 0.7 % SOLN INT 1 GTT IN OU QD PRN  5  .  prazosin (MINIPRESS) 2 MG capsule Take 2 mg by mouth at bedtime.    . Probiotic Product (PROBIOTIC PO) Take 1 capsule by mouth daily.    . QUEtiapine (SEROQUEL) 25 MG tablet Take 25 mg by mouth at bedtime.    Marland Kitchen Specialty Vitamins Products (MAGNESIUM, AMINO ACID CHELATE,) 133 MG tablet Take 1 tablet by mouth 2 (two) times daily.    . Vitamin D, Ergocalciferol, (DRISDOL) 50000 units CAPS capsule Take 50,000 Units by mouth every 7 (seven) days. Takes on Wednesday.    . propranolol ER (INDERAL LA) 60 MG 24 hr capsule Take one daily 30 capsule 6   No facility-administered medications prior to visit.    PAST MEDICAL HISTORY: Past Medical History:  Diagnosis Date  . Anxiety   . Bipolar 2 disorder ([place])   . Family history of breast cancer   . Genetic testing 03/18/2017   Multi-Cancer panel (83 genes) @ [facility] - No pathogenic mutations  detected  . Migraine   . Mood disorder ([place])   . OSA (obstructive sleep apnea)    uses dental device    PAST SURGICAL HISTORY: Past Surgical History:  Procedure Laterality Date  . LAPAROSCOPIC OOPHERECTOMY    . WISDOM TOOTH EXTRACTION      FAMILY HISTORY: Family History  Problem Relation Age of Onset  . Breast cancer Mother 52       BSO 11; Neg genetic testing (17 genes [facility] 2016)  . Breast cancer Paternal Grandmother 18       deceased 77s  . Hypercholesterolemia Paternal Grandmother   . Hypertension Paternal Grandfather   . Hypercholesterolemia Maternal Grandmother   . Thyroid cancer Maternal Grandmother 33       currently 10  . Heart disease Maternal Grandfather   . Ovarian cancer Maternal Aunt 71       currently 13  . Hypothyroidism Sister        in 1 sister  . Polycystic ovary syndrome Sister     SOCIAL HISTORY: Social History   Socioeconomic History  . Marital status: Married    Spouse name: Not on file  . Number of children: 0  . Years of education: Not on file  . Highest education level: Not on file  Occupational History  . Occupation: Ship broker    Comment: Investment banker, corporate  Tobacco Use  . Smoking status: Never Smoker  . Smokeless tobacco: Never Used  Substance and Sexual Activity  . Alcohol use: No    Alcohol/week: 0.0 standard drinks  . Drug use: No  . Sexual activity: Yes    Partners: Male  Other Topics Concern  . Not on file  Social History Narrative   Lives at home with husband.   Right-handed.   No caffeine use.   Social Determinants of Health   Financial Resource Strain:   . Difficulty of Paying Living Expenses:   Food Insecurity:   . Worried About Charity fundraiser in the Last Year:   . Arboriculturist in the Last Year:   Transportation Needs:   . Film/video editor (Medical):   Marland Kitchen Lack of Transportation (Non-Medical):   Physical Activity:   . Days of Exercise per Week:   . Minutes of Exercise per Session:     Stress:   . Feeling of Stress :   Social Connections:   . Frequency of Communication with Friends and Family:   . Frequency of Social Gatherings with Friends and Family:   . Attends Religious  Services:   . Active Member of Clubs or Organizations:   . Attends Archivist Meetings:   Marland Kitchen Marital Status:   Intimate Partner Violence:   . Fear of Current or Ex-Partner:   . Emotionally Abused:   Marland Kitchen Physically Abused:   . Sexually Abused:     PHYSICAL EXAM  Vitals:   09/21/19 1446  BP: 100/67  Pulse: 69  Temp: 97.8 F (36.6 C)  Weight: 160 lb (72.6 kg)  Height: 5\' 10"  (1.778 m)   Body mass index is 22.96 kg/m.  Generalized: Well developed, in no acute distress   Neurological examination  Mentation: Alert oriented to time, place, history taking. Follows all commands speech and language fluent Cranial nerve II-XII: Pupils were equal round reactive to light. Extraocular movements were full, visual field were full on confrontational test. Facial sensation and strength were normal. Head turning and shoulder shrug were normal and symmetric. Motor: The motor testing reveals 5 over 5 strength of all 4 extremities. Good symmetric motor tone is noted throughout.  Sensory: Sensory testing is intact to soft touch on all 4 extremities. No evidence of extinction is noted.  Coordination: Cerebellar testing reveals good finger-nose-finger and heel-to-shin bilaterally.  Gait and station: Gait is normal. Tandem gait is normal. Romberg is negative. No drift is seen.  Reflexes: Deep tendon reflexes are symmetric and normal bilaterally.   DIAGNOSTIC DATA (LABS, IMAGING, TESTING) - I reviewed patient records, labs, notes, testing and imaging myself where available.  Lab Results  Component Value Date   WBC 7.8 09/13/2019   HGB 13.8 09/13/2019   HCT 40.9 09/13/2019   MCV 98.4 09/13/2019   PLT 250.0 09/13/2019      Component Value Date/Time   NA 138 09/13/2019 1536   K 4.1 09/13/2019  1536   CL 104 09/13/2019 1536   CO2 29 09/13/2019 1536   GLUCOSE 89 09/13/2019 1536   BUN 7 09/13/2019 1536   CREATININE 0.81 09/13/2019 1536   CALCIUM 9.7 09/13/2019 1536   PROT 7.5 09/13/2019 1536   ALBUMIN 4.9 09/13/2019 1536   AST 14 09/13/2019 1536   ALT 12 09/13/2019 1536   ALKPHOS 83 09/13/2019 1536   BILITOT 0.7 09/13/2019 1536   No results found for: CHOL, HDL, LDLCALC, LDLDIRECT, TRIG, CHOLHDL No results found for: HGBA1C No results found for: VITAMINB12 No results found for: TSH  ASSESSMENT AND PLAN 29 y.o. year old female  has a past medical history of Anxiety, Bipolar 2 disorder ([place]), Family history of breast cancer, Genetic testing (03/18/2017), Migraine, Mood disorder ([place]), and OSA (obstructive sleep apnea). here with:  1.  Chronic migraine headache -Reports on average 1-2 migraines a week -Has previously tried and failed Topamax, is currently on Inderal, also history of mood disorder taking BuSpar, Lamictal, prazosin, Latuda, Seroquel -We will start Aimovig 70 mg monthly injection for migraine prevention -Continue Inderal LA 60 mg daily for headache prevention, if headaches improve on Aimovig, may consider discontinuation of this, her concern of low BP -Continue diclofenac potassium as needed for acute headache -Has previously not benefited from triptans, did not like the way it made her feel -MRI of the brain in February 2021 was unremarkable -She will follow-up in 4 months or sooner if needed to determine benefit of Aimovig  I spent 20 minutes of face-to-face and non-face-to-face time with patient.  This included previsit chart review, lab review, study review, order entry, electronic health record documentation, patient education.  Butler Denmark, AGNP-C, DNP 09/21/2019,  4:25 PM [facility] [address], [place], [place] [address] [phone]

## 2019-09-21 NOTE — Patient Instructions (Addendum)
Try Aimovig 70 mg once a month injection Continue Inderal for the time being You can take diclofenac as needed for headache See you back in 4 months

## 2019-10-03 NOTE — Progress Notes (Signed)
I have reviewed and agreed above plan. 

## 2019-10-27 ENCOUNTER — Encounter: Payer: Self-pay | Admitting: Neurology

## 2019-11-01 NOTE — Telephone Encounter (Signed)
Initiated [place] [id number] for aimovig 70mg .  Determination pending.

## 2019-11-02 NOTE — Telephone Encounter (Signed)
Received approval for aimovig 70mg  PA# [id number]  Approved thru 05-02-20.  Fax confirmation received [facility] [phone]

## 2020-01-18 ENCOUNTER — Ambulatory Visit: Payer: 59 | Admitting: Neurology

## 2020-01-18 ENCOUNTER — Encounter: Payer: Self-pay | Admitting: Neurology

## 2020-01-18 VITALS — Ht 70.0 in | Wt 160.0 lb

## 2020-01-18 DIAGNOSIS — G43109 Migraine with aura, not intractable, without status migrainosus: Secondary | ICD-10-CM | POA: Diagnosis not present

## 2020-01-18 MED ORDER — PROPRANOLOL HCL 20 MG PO TABS: 20.0000 mg | ORAL_TABLET | Freq: Two times a day (BID) | ORAL | 0 refills | Status: DC

## 2020-01-18 MED ORDER — EMGALITY 120 MG/ML ~~LOC~~ SOAJ: 120.0000 mg | SUBCUTANEOUS | 11 refills | Status: DC

## 2020-01-18 NOTE — Patient Instructions (Signed)
Stop the Aimovig, switch to [facility] for migraine prevention  Let's stop the Inderal LA, switch to the 20 mg twice daily x 1 week, then take 1 twice daily every other day for 1 week then stop  Continue Excedrin migraine as needed for acute headache  See you back in 6 months

## 2020-01-18 NOTE — Progress Notes (Signed)
PATIENT: Katelyn King DOB: Dec 20, 1990  REASON FOR VISIT: follow up HISTORY FROM: patient  HISTORY OF PRESENT ILLNESS: Today 01/18/20  HISTORY  06/11/2015 Katelyn King a 29 years old right-handed female, accompanied by her husband, seen in refer by her primary care physician Katelyn. Antony Contras for evaluation of chronic migraine  She had a history of bipolar type II, is taking lamotrigine 200 mg every night, Latuda 20 mg every night,  She reported a history of migraine since elementary school, her typical migraine are lateralized severe pounding headache was associated light noise sensitivity, lasting 6-8 hours, she brought headache diary at today's visit, she has average 6 to 8 typical migraines each month, moderate to severe, she has been taking over-the-counter Tylenol, sometimes with caffeinated drink to help her headache, occasionally her migraines preceded by visual distortion.  Trigger for her headache a sleep deprivation, weather change, stress,  Laboratory in 2016, normal glucose 79, LDL 72, normal CBC, hemoglobin 13.8, normal ferritin 70 7.9, TSH 3.79  I have personally reviewed CAT scan of the brain without contrast January 3rd 2016, that was within normal limit  05/19/17 CMMs. Katelyn King, 29 year old female returns for follow-up. She has a history of migraines . She is currently on Inderal LA 60 mg daily . This is been a good migraine preventive for her her blood pressure today 104/67. She has one migraine per week for which she uses Relpax. She has other headaches related to stress. Recent sleep study confirmed mild obstructive sleep apnea however she has stopped her CPAP and has just been fitted with a dental appliance. She recently had a visual aura prior to her headache. She has rarely had these in the past. She continues to have headaches around her menstrual cycle. Her triggers are weather changes hormones and sun light She recently had an IUD placed She  states the "Inderal has changed my life ". She also has a history of bipolar disorder and sees psychiatrist and psychologist. She sees 2 different counselors. She takes melatonin for insomnia with relief. January 2017 CT of the brain was normal. . She returns for reevaluation 4/26/2019CMMs. Katelyn King, 29 year old female returns for follow-up with history of migraines and mild obstructive sleep apnea. She is currently on propanolol LA 60 mg daily with good control of her headaches. She has had one bad migraine in the last 4 weeks. She has failed Maxalt and Relpax in the past and really does not want to try another acute medication since her headaches are in good control. Her migraine triggers are weather changes in hormones and sunlight. She wears migraine glasses. She is using a appliance for her obstructive sleep apnea with good results. She also has a history of bipolar disorder and sees psychiatry and psychologist. She returns for reevaluation.  August 17, 2018 SS: Visit via [employer]; 29 yo female with history of migraines. She is currently taking propanolol LA 60 mg daily. In the past she has failed Maxalt and Relpax, fiorcet. She was initially evaluated by Katelyn. Krista Blue in 2017 for migraine, she has history of h/a since elementary school. She has history of bipolar disorder, taking lamotrigine 300 mg at bedtime, Latuda. She hasclose follow-up with her psychiatrist.She was recently started on prazosinfor nightmares.She reports that her headaches have been doing well, but with recent stress with Covid-19she has been having more headaches, has had 3 headaches this week. Usually she would only have 4/month. She reports when she does get a headache she will take Tylenol and/or ibuprofen  and this does help with the pain. She has further stressed because she is a Charity fundraiser at [employer] and her school is currently out for the virus, will delay her graduation next year. She describes her  typical migraine headache as occurring either on the right side or left side of her head, phonophobia, dizziness, nausea, irritability. In the past she has tried Topamax but was unable to tolerate. She denies any changes to her medical history.  Update January 272021 SS:[name] via virtual visit for evaluation of migraine headache, since last seen in March, she reports her headaches have been doing well1-2 per month,but had different kind of headache last week, 4 days, and 2 days this week.She had increased stress last week, started school, she had an anxiety attack, developed spots in her vision, had headache to left side, was sharp pain,intermittently says she lost hearing in her left ear, hadblurry vision, altered sensation to her left mid face,she went to urgent care, was tested for Covid, was negative,has been taking ibuprofen.This week has headache on the right side, not as intense as last week.Is concerned that her headache pattern is changed, no slurred speech, gait change, or numbness or weakness in her arms or legs. She remains under the care of a psychiatrist, is prescribed Seroquel, Lamictal, BuSpar, prazosin.She remains on Inderal LA60 mg daily for migraine prevention.Her blood pressure remains on the lower end 110/75, heart rate 63. She now feels weak, run down from the headaches.She has not been able to tolerate Maxalt or Relpax previously,she has been treated with prednisone, said it made her not sleep.  Update September 21, 2019 SS: MRI of the brain in February 2021 was unremarkable.  Indicates her headaches have improved, she went to the eye doctor, was found to have an astigmatism, now has new glasses.  This is greatly helped, as she is doing a lot of work on the computer.  With headaches, reports 1-2 migraines a week.  She will take diclofenac with good benefit.  With headaches, she will see spots in her vision, before the headache.  Update January 18, 2020  SS: On average 1 migraine a week, used to be 3 times a week, headache may now be more intense, have nausea.  Taking Aimovig 70 mg monthly injection, Inderal LA 60 mg daily.  Excedrin Migraine works great for headache, diclofenac potassium not helpful.  Also taking Lamictal, Latuda, prazosin for mood disorder.  Notes dizziness, was fairly orthostatic with standing.  Has IBS, having more constipation with Aimovig. May see spots in her vision, without development of headache with Aimovig, eye exam was normal .  REVIEW OF SYSTEMS: Out of a complete 14 system review of symptoms, the patient complains only of the following symptoms, and all other reviewed systems are negative.  Headache  ALLERGIES: Allergies  Allergen Reactions  . Topiramate Other (See Comments)    suicidal thoughts  . Lexapro [Escitalopram Oxalate]     Worsened depression.  Santiago Bur [Nitrofurantoin Macrocrystal] Rash    HOME MEDICATIONS: Outpatient Medications Prior to Visit  Medication Sig Dispense Refill  . b complex vitamins tablet Take 1 tablet by mouth daily.    . busPIRone (BUSPAR) 10 MG tablet Take 10 mg by mouth 2 (two) times daily.    Marland Kitchen lamoTRIgine (LAMICTAL) 150 MG tablet Take 300 mg by mouth at bedtime.    Marland Kitchen levonorgestrel (MIRENA) 20 MCG/24HR IUD 1 each by Intrauterine route once.    . loratadine (CLARITIN) 10 MG tablet Take  10 mg by mouth daily.    Marland Kitchen lurasidone (LATUDA) 40 MG TABS tablet Take 40 mg by mouth daily with breakfast.    . PAZEO 0.7 % SOLN INT 1 GTT IN OU QD PRN  5  . prazosin (MINIPRESS) 2 MG capsule Take 2 mg by mouth at bedtime.    . Probiotic Product (PROBIOTIC PO) Take 1 capsule by mouth daily.    Marland Kitchen Specialty Vitamins Products (MAGNESIUM, AMINO ACID CHELATE,) 133 MG tablet Take 1 tablet by mouth 2 (two) times daily.    . Vitamin D, Ergocalciferol, (DRISDOL) 50000 units CAPS capsule Take 50,000 Units by mouth every 7 (seven) days. Takes on Wednesday.    . diclofenac (CATAFLAM) 50 MG tablet Take  1 tablet (50 mg total) by mouth 2 (two) times daily as needed. 30 tablet 1  . Erenumab-aooe (AIMOVIG) 70 MG/ML SOAJ Inject 70 mg into the skin every 30 (thirty) days. 1 pen 11  . propranolol ER (INDERAL LA) 60 MG 24 hr capsule Take one daily 30 capsule 6  . QUEtiapine (SEROQUEL) 25 MG tablet Take 25 mg by mouth at bedtime.     No facility-administered medications prior to visit.    PAST MEDICAL HISTORY: Past Medical History:  Diagnosis Date  . Anxiety   . Bipolar 2 disorder ([place])   . Family history of breast cancer   . Genetic testing 03/18/2017   Multi-Cancer panel (83 genes) @ [facility] - No pathogenic mutations detected  . Migraine   . Mood disorder ([place])   . OSA (obstructive sleep apnea)    uses dental device    PAST SURGICAL HISTORY: Past Surgical History:  Procedure Laterality Date  . LAPAROSCOPIC OOPHERECTOMY    . WISDOM TOOTH EXTRACTION      FAMILY HISTORY: Family History  Problem Relation Age of Onset  . Breast cancer Mother 78       BSO 56; Neg genetic testing (17 genes BreastNext 2016)  . Breast cancer Paternal Grandmother 31       deceased 39s  . Hypercholesterolemia Paternal Grandmother   . Hypertension Paternal Grandfather   . Hypercholesterolemia Maternal Grandmother   . Thyroid cancer Maternal Grandmother 41       currently 39  . Heart disease Maternal Grandfather   . Ovarian cancer Maternal Aunt 68       currently 53  . Hypothyroidism Sister        in 1 sister  . Polycystic ovary syndrome Sister     SOCIAL HISTORY: Social History   Socioeconomic History  . Marital status: Married    Spouse name: Not on file  . Number of children: 0  . Years of education: Not on file  . Highest education level: Not on file  Occupational History  . Occupation: Ship broker    Comment: Investment banker, corporate  Tobacco Use  . Smoking status: Never Smoker  . Smokeless tobacco: Never Used  Substance and Sexual Activity  . Alcohol use: No    Alcohol/week: 0.0 standard  drinks  . Drug use: No  . Sexual activity: Yes    Partners: Male  Other Topics Concern  . Not on file  Social History Narrative   Lives at home with husband.   Right-handed.   No caffeine use.   Social Determinants of Health   Financial Resource Strain:   . Difficulty of Paying Living Expenses: Not on file  Food Insecurity:   . Worried About Charity fundraiser in the Last Year: Not on  file  . [place] in the Last Year: Not on file  Transportation Needs:   . Lack of Transportation (Medical): Not on file  . Lack of Transportation (Non-Medical): Not on file  Physical Activity:   . Days of Exercise per Week: Not on file  . Minutes of Exercise per Session: Not on file  Stress:   . Feeling of Stress : Not on file  Social Connections:   . Frequency of Communication with Friends and Family: Not on file  . Frequency of Social Gatherings with Friends and Family: Not on file  . Attends Religious Services: Not on file  . Active Member of Clubs or Organizations: Not on file  . Attends Archivist Meetings: Not on file  . Marital Status: Not on file  Intimate Partner Violence:   . Fear of Current or Ex-Partner: Not on file  . Emotionally Abused: Not on file  . Physically Abused: Not on file  . Sexually Abused: Not on file   PHYSICAL EXAM  Vitals:   01/18/20 1414  Weight: 160 lb (72.6 kg)  Height: 5\' 10"  (1.778 m)   Body mass index is 22.96 kg/m. Orthostatic VS for the past 24 hrs:  BP- Lying Pulse- Lying BP- Sitting Pulse- Sitting BP- Standing at 0 minutes Pulse- Standing at 0 minutes  01/18/20 1416 97/60 63 108/70 81 91/61 106   Generalized: Well developed, in no acute distress   Neurological examination  Mentation: Alert oriented to time, place, history taking. Follows all commands speech and language fluent Cranial nerve II-XII: Pupils were equal round reactive to light. Extraocular movements were full, visual field were full on confrontational test.  Facial sensation and strength were normal. Head turning and shoulder shrug  were normal and symmetric. Motor: The motor testing reveals 5 over 5 strength of all 4 extremities. Good symmetric motor tone is noted throughout.  Sensory: Sensory testing is intact to soft touch on all 4 extremities. No evidence of extinction is noted.  Coordination: Cerebellar testing reveals good finger-nose-finger and heel-to-shin bilaterally. Mild tremor with finger nose finger bilaterally Gait and station: Gait is normal.  Reflexes: Deep tendon reflexes are symmetric and normal bilaterally.   DIAGNOSTIC DATA (LABS, IMAGING, TESTING) - I reviewed patient records, labs, notes, testing and imaging myself where available.  Lab Results  Component Value Date   WBC 7.8 09/13/2019   HGB 13.8 09/13/2019   HCT 40.9 09/13/2019   MCV 98.4 09/13/2019   PLT 250.0 09/13/2019      Component Value Date/Time   NA 138 09/13/2019 1536   K 4.1 09/13/2019 1536   CL 104 09/13/2019 1536   CO2 29 09/13/2019 1536   GLUCOSE 89 09/13/2019 1536   BUN 7 09/13/2019 1536   CREATININE 0.81 09/13/2019 1536   CALCIUM 9.7 09/13/2019 1536   PROT 7.5 09/13/2019 1536   ALBUMIN 4.9 09/13/2019 1536   AST 14 09/13/2019 1536   ALT 12 09/13/2019 1536   ALKPHOS 83 09/13/2019 1536   BILITOT 0.7 09/13/2019 1536   No results found for: CHOL, HDL, LDLCALC, LDLDIRECT, TRIG, CHOLHDL No results found for: HGBA1C No results found for: VITAMINB12 No results found for: TSH  ASSESSMENT AND PLAN 29 y.o. year old female  has a past medical history of Anxiety, Bipolar 2 disorder ([place]), Family history of breast cancer, Genetic testing (03/18/2017), Migraine, Mood disorder ([place]), and OSA (obstructive sleep apnea). here with:  1.  Chronic migraine headache  -On average 1 migraine weekly,  side effect with Aimovig, and Inderal LA  -Switch to Emgality 120 mg monthly injection for migraine prevention, stop Aimovig  -Wean off Inderal LA 60 mg, send in  20 mg propanolol twice daily x 1 week, then take twice daily every other day x 1 week then stop   -Can continue Excedrin Migraine for acute headache  -Previously tried and failed Topamax  -History of mood disorder taking BuSpar, Lamictal, prazosin, Latuda  -Previously not benefited from triptans, did not like the way it made her feel  -MRI of the brain in February 2021 was unremarkable  -Follow-up in 6 months or sooner if needed  I spent 30 minutes of face-to-face and non-face-to-face time with patient.  This included previsit chart review, lab review, study review, order entry, electronic health record documentation, patient education.  Butler Denmark, AGNP-C, DNP 01/18/2020, 2:49 PM [facility] [address], [place], [place] [address] [phone]

## 2020-01-24 ENCOUNTER — Telehealth: Payer: Self-pay | Admitting: Neurology

## 2020-01-24 NOTE — Telephone Encounter (Signed)
Received PA request from [facility] for patient's Emgality. PA was started on [email or web address]. Key for PA is [id number]. Per CMM, a determination will be made within 72 hours. Will check back later for a determination.

## 2020-01-25 NOTE — Telephone Encounter (Signed)
Called [facility] at [phone] and spoke with Messi. She stated that the pharmacist had called to clarify the amount of headache/migraine days the patient has had. Provided them with the information from her OV on 8/26. Information has been sent over to the pharmacy review department. PA determination should be received by the afternoon. Will continue to wait.

## 2020-01-25 NOTE — Telephone Encounter (Signed)
Jannica from [place] for [facility] called to get additional information form CMA.  When phone rep went back to inform Jannica that the CMA was unavailable [place] was no longer on line.  This is Pharmacist, hospital

## 2020-01-31 NOTE — Telephone Encounter (Signed)
Pt returned my call. I advised her that her insurance has denied coverage of emgality. I educated her on the use of the emgality copay card. Pt will download the savings card and present it to her pharmacy. Pt will call us with questions or concerns.

## 2020-01-31 NOTE — Telephone Encounter (Signed)
PA for emgality was denied by [facility] because she does not have greater than or equal to 15 headachedays per month or greater than or equal to 8 migraine days per month.  Pt is eligible for copay card. She should go to [email or web address] and download the card and present it to her pharmacy.  I called pt to discuss. No answer, left a message asking her to call me back.

## 2020-02-08 ENCOUNTER — Encounter: Payer: Self-pay | Admitting: Neurology

## 2020-02-14 ENCOUNTER — Encounter: Payer: Self-pay | Admitting: Neurology

## 2020-02-15 ENCOUNTER — Other Ambulatory Visit: Payer: Self-pay

## 2020-02-15 DIAGNOSIS — R112 Nausea with vomiting, unspecified: Secondary | ICD-10-CM

## 2020-02-15 DIAGNOSIS — R109 Unspecified abdominal pain: Secondary | ICD-10-CM

## 2020-02-15 DIAGNOSIS — R634 Abnormal weight loss: Secondary | ICD-10-CM

## 2020-02-15 NOTE — Telephone Encounter (Signed)
Beth, thank you for the update. Please schedule her for a follow up office visit if not already done.

## 2020-02-15 NOTE — Telephone Encounter (Signed)
Katelyn King, I agree patient needs to contact neuro regarding severe headache. Her vomiting may neurological, secondary to headaches. I agree with your clear liquid instructions for now. Please send the patient to the lab for a CBC with diff, CMP, lipase and CRP. Schedule an abdominal sonogram. If she can tolerate Zofran 4mg  ODT 1 tab on tongue Q 6 to 8 hours PRN # 20, no refills, Phenergan 12.5 mg suppository 1 PR Q 12 hrs PRN N/V. Patient to go to the ED if her N/V or headache worsens. Please schedule the patient for a follow up appt in our office asap. Thx

## 2020-02-22 ENCOUNTER — Other Ambulatory Visit: Payer: Self-pay

## 2020-02-22 ENCOUNTER — Ambulatory Visit (HOSPITAL_COMMUNITY)
Admission: RE | Admit: 2020-02-22 | Discharge: 2020-02-22 | Disposition: A | Discharge status: Home / Self Care | Payer: 59 | Source: Ambulatory Visit | Attending: Nurse Practitioner | Admitting: Nurse Practitioner

## 2020-02-22 DIAGNOSIS — R112 Nausea with vomiting, unspecified: Secondary | ICD-10-CM | POA: Diagnosis present

## 2020-02-22 DIAGNOSIS — R634 Abnormal weight loss: Secondary | ICD-10-CM | POA: Insufficient documentation

## 2020-02-22 DIAGNOSIS — R109 Unspecified abdominal pain: Secondary | ICD-10-CM | POA: Insufficient documentation

## 2020-02-28 MED ORDER — PROPRANOLOL HCL ER 60 MG PO CP24: 60.0000 mg | ORAL_CAPSULE | Freq: Every day | ORAL | 5 refills | Status: DC

## 2020-03-20 ENCOUNTER — Ambulatory Visit: Payer: 59 | Admitting: Nurse Practitioner

## 2020-04-01 ENCOUNTER — Other Ambulatory Visit (INDEPENDENT_AMBULATORY_CARE_PROVIDER_SITE_OTHER): Payer: 59

## 2020-04-01 DIAGNOSIS — R109 Unspecified abdominal pain: Secondary | ICD-10-CM

## 2020-04-01 DIAGNOSIS — R634 Abnormal weight loss: Secondary | ICD-10-CM | POA: Diagnosis not present

## 2020-04-01 DIAGNOSIS — R112 Nausea with vomiting, unspecified: Secondary | ICD-10-CM

## 2020-04-01 LAB — CBC WITH DIFFERENTIAL/PLATELET
Basophils Absolute: 0.1 10*3/uL (ref 0.0–0.1)
Basophils Relative: 0.7 % (ref 0.0–3.0)
Eosinophils Absolute: 0.3 10*3/uL (ref 0.0–0.7)
Eosinophils Relative: 2.7 % (ref 0.0–5.0)
HCT: 40.1 % (ref 36.0–46.0)
Hemoglobin: 13.6 g/dL (ref 12.0–15.0)
Lymphocytes Relative: 23.4 % (ref 12.0–46.0)
Lymphs Abs: 2.2 10*3/uL (ref 0.7–4.0)
MCHC: 33.9 g/dL (ref 30.0–36.0)
MCV: 96.2 fl (ref 78.0–100.0)
Monocytes Absolute: 0.6 10*3/uL (ref 0.1–1.0)
Monocytes Relative: 6.7 % (ref 3.0–12.0)
Neutro Abs: 6.3 10*3/uL (ref 1.4–7.7)
Neutrophils Relative %: 66.5 % (ref 43.0–77.0)
Platelets: 257 10*3/uL (ref 150.0–400.0)
RBC: 4.17 Mil/uL (ref 3.87–5.11)
RDW: 12 % (ref 11.5–15.5)
WBC: 9.5 10*3/uL (ref 4.0–10.5)

## 2020-04-01 LAB — COMPREHENSIVE METABOLIC PANEL
ALT: 12 U/L (ref 0–35)
AST: 15 U/L (ref 0–37)
Albumin: 4.8 g/dL (ref 3.5–5.2)
Alkaline Phosphatase: 86 U/L (ref 39–117)
BUN: 7 mg/dL (ref 6–23)
CO2: 28 mEq/L (ref 19–32)
Calcium: 9.6 mg/dL (ref 8.4–10.5)
Chloride: 101 mEq/L (ref 96–112)
Creatinine, Ser: 0.72 mg/dL (ref 0.40–1.20)
GFR: 112 mL/min (ref >60.00)
Glucose, Bld: 84 mg/dL (ref 70–99)
Potassium: 4 mEq/L (ref 3.5–5.1)
Sodium: 137 mEq/L (ref 135–145)
Total Bilirubin: 0.6 mg/dL (ref 0.2–1.2)
Total Protein: 7.1 g/dL (ref 6.0–8.3)

## 2020-04-01 LAB — LIPASE: Lipase: 16 U/L (ref 11.0–59.0)

## 2020-04-01 LAB — C-REACTIVE PROTEIN: CRP: <1 mg/dL (ref 0.5–20.0)

## 2020-04-07 ENCOUNTER — Encounter: Payer: Self-pay | Admitting: Neurology

## 2020-04-24 NOTE — Progress Notes (Signed)
04/24/2020 Katelyn King [id number] [birth year]   Chief Complaint:  Nausea   History of Present Illness: Katelyn King is a 29 year old female with a past medical history of anxiety, bipolar disorder, PTSD, migraine headaches and OSA uses a dental appliance. I initially saw the patient in office on 09/13/2019 due to having possible food intolerances. At that time, a food allergy panel was done which was normal. No nausea at that time. She developed nausea and an abdominal sonogram was done 9/30 which was normal. CBC and CMP were normal. She presents today with complains of nausea and weight loss. She reports having migraine headaches with N/V x 4 episodes over the past 2 months on Propanolol. She had more N/V when she was on Amovig which was discontinued about 3 months ago. She has nausea approximately 3 days weekly, no specific food triggers. FDgard reduces her nausea but is not affordable. No associated abdominal pain. No change in her psychotropic medications, she remains on Buspirone 10mg  bid x 3 years, Latuda 40mg  QD and Lamictal 150mg  QD x 9 years. Mirena IUD in use.  She had some constipation when she took [place]. She currently passes a normal brown BM every few days. Sometimes had diarrhea for one day once or twice monthly. No bloody diarrhea. No rectal bleeding or melena. She eats 2 meals daily. She reports unintentionally losing 10lbs over the past 2 months. Stress level is elevated, looking for employment for the past 2 years.   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   CBC Latest Ref Rng & Units 04/01/2020 09/13/2019  WBC 4.0 - 10.5 K/uL 9.5 7.8  Hemoglobin 12.0 - 15.0 g/dL 13.6 13.8  Hematocrit 36 - 46 % 40.1 40.9  Platelets 150 - 400 K/uL 257.0 250.0   CMP Latest Ref Rng & Units 04/01/2020 09/13/2019    Glucose 70 - 99 mg/dL 84 89  BUN 6 - 23 mg/dL 7 7  Creatinine 0.40 - 1.20 mg/dL 0.72 0.81  Sodium 135 - 145 mEq/L 137 138  Potassium 3.5 - 5.1 mEq/L 4.0 4.1  Chloride 96 - 112 mEq/L 101 104  CO2 19 - 32 mEq/L 28 29  Calcium 8.4 - 10.5 mg/dL 9.6 9.7  Total Protein 6.0 - 8.3 g/dL 7.1 7.5  Total Bilirubin 0.2 - 1.2 mg/dL 0.6 0.7  Alkaline Phos 39 - 117 U/L 86 83  AST 0 - 37 U/L 15 14  ALT 0 - 35 U/L 12 12    Current Outpatient Medications on File Prior to Visit  Medication Sig Dispense Refill  . b complex vitamins tablet Take 1 tablet by mouth daily.    . busPIRone (BUSPAR) 10 MG tablet Take 10 mg by mouth 2 (two) times daily.    . Cholecalciferol (VITAMIN D3) 125 MCG (5000 UT) CAPS Take 1 capsule by mouth daily.    Marland Kitchen lamoTRIgine (LAMICTAL) 150 MG tablet Take 300 mg by mouth at bedtime.    Marland Kitchen levonorgestrel (MIRENA) 20 MCG/24HR IUD 1 each by Intrauterine route once.    . loratadine (CLARITIN) 10 MG tablet Take 10 mg by mouth daily.    Marland Kitchen lurasidone (LATUDA) 40 MG TABS tablet Take 40 mg by mouth daily with breakfast.    . PAZEO 0.7 % SOLN INT 1 GTT IN OU QD PRN  5  . Probiotic Product (PROBIOTIC PO) Take  1 capsule by mouth daily.    . propranolol ER (INDERAL LA) 60 MG 24 hr capsule Take 1 capsule (60 mg total) by mouth daily. 30 capsule 5  . Specialty Vitamins Products (MAGNESIUM, AMINO ACID CHELATE,) 133 MG tablet Take 1 tablet by mouth 2 (two) times daily.     No current facility-administered medications on file prior to visit.   Allergies  Allergen Reactions  . Topiramate Other (See Comments)    suicidal thoughts  . Lexapro [Escitalopram Oxalate]     Worsened depression.  Santiago Bur [Nitrofurantoin Macrocrystal] Rash    Current Medications, Allergies, Past Medical History, Past Surgical History, Family History and Social History were reviewed in [employer] record.   Review of Systems:   Constitutional: Negative for fever, sweats, chills or weight  loss.  Respiratory: Negative for shortness of breath.   Cardiovascular: Negative for chest pain, palpitations and leg swelling.  Gastrointestinal: See HPI.  Musculoskeletal: Negative for back pain or muscle aches.  Neurological: Negative for dizziness, headaches or paresthesias.    Physical Exam: There were no vitals taken for this visit.   Wt Readings from Last 3 Encounters:  04/25/20 151 lb (68.5 kg)  01/18/20 160 lb (72.6 kg)  09/21/19 160 lb (72.6 kg)   General: Well developed 30 year old female in no acute distress. Head: Normocephalic and atraumatic. Lungs: Clear throughout to auscultation. Heart: Regular rate and rhythm, no murmur. Abdomen: Soft, nontender and nondistended. No masses or hepatomegaly. Normal bowel sounds x 4 quadrants.  Rectal: Deferred.  Musculoskeletal: Symmetrical with no gross deformities. Extremities: No edema. Neurological: Alert oriented x 4. No focal deficits.  Psychological: Alert and cooperative. Normal mood and affect  Assessment and Recommendations: 1. Nausea, unclear etiology (possible due to psychotropic medications). Normal abdominal sonogram 9/20201. EGD 04/2019 by [facility] showed inactive gastritis with incidental pancreatic metaplasia.  -Famotidine 20mg  one po daily for possible gastritis.  -FDgard 2 po bid -Call office if symptoms worsen  -Follow up in office with Dr. Rush Landmark in 3 months   2. Weight loss -Check weight in 2 and 4 weeks, patient to send me my chart msg with weight update in 2 and 4 weeks. -Discussed scheduling an abd/pelvic CT scan if weight loss continues   3. N/V with migraine headaches   4. Anxiety. Increased stress level.

## 2020-04-25 ENCOUNTER — Ambulatory Visit: Payer: 59 | Admitting: Nurse Practitioner

## 2020-04-25 ENCOUNTER — Encounter: Payer: Self-pay | Admitting: Nurse Practitioner

## 2020-04-25 VITALS — BP 124/70 | HR 81 | Ht 70.0 in | Wt 151.0 lb

## 2020-04-25 DIAGNOSIS — R110 Nausea: Secondary | ICD-10-CM

## 2020-04-25 DIAGNOSIS — R634 Abnormal weight loss: Secondary | ICD-10-CM

## 2020-04-25 MED ORDER — FAMOTIDINE 20 MG PO TABS: 20.0000 mg | ORAL_TABLET | Freq: Every day | ORAL | 1 refills | Status: DC

## 2020-04-25 NOTE — Patient Instructions (Addendum)
Continue Using FDgard- samples given today.   START: Famotidine 20mg  before bedtime.( Do not take within 4 hours of other medication.)  We have sent the following medications to your pharmacy for you to pick up at your convenience: Famotidine   If you are age 30 or younger, your body mass index should be between 19-25. Your Body mass index is 21.67 kg/m. If this is out of the aformentioned range listed, please consider follow up with your Primary Care Provider.   Follow-up with Dr. Rush Landmark in 3 months. Office will call with an appointment later.   Thank you for choosing me and [facility].  [place]

## 2020-04-26 NOTE — Progress Notes (Signed)
Attending Physician's Attestation   I have reviewed the chart.   I agree with the Advanced Practitioner's note, impression, and recommendations with any updates as below. Would consider Thyroid testing as well as AM Cortisol evaluation as well be completed.   Justice Britain, MD [facility] Office # [phone]

## 2020-04-30 ENCOUNTER — Other Ambulatory Visit: Payer: Self-pay

## 2020-04-30 ENCOUNTER — Telehealth: Payer: Self-pay

## 2020-04-30 DIAGNOSIS — R634 Abnormal weight loss: Secondary | ICD-10-CM

## 2020-04-30 DIAGNOSIS — R112 Nausea with vomiting, unspecified: Secondary | ICD-10-CM

## 2020-04-30 NOTE — Telephone Encounter (Signed)
-----   Message from Noralyn Pick, NP sent at 04/26/2020  9:37 AM EST ----- Beth, pls contact the patient and let her know Dr. Rush Landmark would like her to have additional blood test to check a TSH level and fasting am cortisol level due to her N/V. Can be done at her earliest convenience. Thx  ----- Message ----- From: Irving Copas., MD Sent: 04/26/2020   2:58 AM EST To: Noralyn Pick, NP    ----- Message ----- From: Noralyn Pick, NP Sent: 04/25/2020  11:50 AM EST To: Irving Copas., MD

## 2020-04-30 NOTE — Telephone Encounter (Signed)
AM-Cortisol and TSH tests ordered.  Called the patient. No answer. Left a message requesting she return my call about getting additional labs. The labs are to be drawn in the morning and she should fast for this.

## 2020-05-07 NOTE — Telephone Encounter (Signed)
Called the patient. No answer. Left a message asking she call me back with any questions or go to the lab for additional labs that do require she fast and come in the morning.

## 2020-05-21 ENCOUNTER — Telehealth: Payer: Self-pay

## 2020-05-21 NOTE — Telephone Encounter (Signed)
Called patient about additional labs, she has been working some long hours, but plans to come into our lab 05/28/20 to have the Fasting labs drawn

## 2020-05-28 ENCOUNTER — Other Ambulatory Visit (INDEPENDENT_AMBULATORY_CARE_PROVIDER_SITE_OTHER): Payer: 59

## 2020-05-28 DIAGNOSIS — R112 Nausea with vomiting, unspecified: Secondary | ICD-10-CM

## 2020-05-28 DIAGNOSIS — R634 Abnormal weight loss: Secondary | ICD-10-CM

## 2020-05-28 LAB — CORTISOL-AM, BLOOD: Cortisol - AM: 18 ug/dL

## 2020-05-28 LAB — TSH: TSH: 3.98 u[IU]/mL (ref 0.35–4.50)

## 2020-07-22 ENCOUNTER — Encounter: Payer: Self-pay | Admitting: Neurology

## 2020-07-22 ENCOUNTER — Ambulatory Visit: Payer: 59 | Admitting: Neurology

## 2020-07-22 VITALS — BP 100/60 | HR 74 | Ht 70.0 in | Wt 152.0 lb

## 2020-07-22 DIAGNOSIS — G43009 Migraine without aura, not intractable, without status migrainosus: Secondary | ICD-10-CM | POA: Diagnosis not present

## 2020-07-22 MED ORDER — NURTEC 75 MG PO TBDP: 75.0000 mg | ORAL_TABLET | ORAL | 11 refills | Status: DC | PRN

## 2020-07-22 MED ORDER — PROPRANOLOL HCL ER 60 MG PO CP24: 60.0000 mg | ORAL_CAPSULE | Freq: Every day | ORAL | 3 refills | Status: DC

## 2020-07-22 NOTE — Progress Notes (Signed)
PATIENT: Katelyn King DOB: Feb 15, 1991  REASON FOR VISIT: follow up HISTORY FROM: patient  HISTORY OF PRESENT ILLNESS: Today 07/22/20  HISTORY  06/11/2015 Dr QBH:ALPFXT Smithis a 30 years old right-handed female, accompanied by her husband, seen in refer by her primary care physician Dr. Antony Contras for evaluation of chronic migraine  She had a history of bipolar type II, is taking lamotrigine 200 mg every night, Latuda 20 mg every night,  She reported a history of migraine since elementary school, her typical migraine are lateralized severe pounding headache was associated light noise sensitivity, lasting 6-8 hours, she brought headache diary at today's visit, she has average 6 to 8 typical migraines each month, moderate to severe, she has been taking over-the-counter Tylenol, sometimes with caffeinated drink to help her headache, occasionally her migraines preceded by visual distortion.  Trigger for her headache a sleep deprivation, weather change, stress,  Laboratory in 2016, normal glucose 79, LDL 72, normal CBC, hemoglobin 13.8, normal ferritin 70 7.9, TSH 3.79  I have personally reviewed CAT scan of the brain without contrast January 3rd 2016, that was within normal limit  05/19/17 CMMs. Tompson, 30 year old female returns for follow-up. She has a history of migraines . She is currently on Inderal LA 60 mg daily . This is been a good migraine preventive for her her blood pressure today 104/67. She has one migraine per week for which she uses Relpax. She has other headaches related to stress. Recent sleep study confirmed mild obstructive sleep apnea however she has stopped her CPAP and has just been fitted with a dental appliance. She recently had a visual aura prior to her headache. She has rarely had these in the past. She continues to have headaches around her menstrual cycle. Her triggers are weather changes hormones and sun light She recently had an IUD placed She  states the "Inderal has changed my life ". She also has a history of bipolar disorder and sees psychiatrist and psychologist. She sees 2 different counselors. She takes melatonin for insomnia with relief. January 2017 CT of the brain was normal. . She returns for reevaluation 4/26/2019CMMs. Spilman, 31 year old female returns for follow-up with history of migraines and mild obstructive sleep apnea. She is currently on propanolol LA 60 mg daily with good control of her headaches. She has had one bad migraine in the last 4 weeks. She has failed Maxalt and Relpax in the past and really does not want to try another acute medication since her headaches are in good control. Her migraine triggers are weather changes in hormones and sunlight. She wears migraine glasses. She is using a appliance for her obstructive sleep apnea with good results. She also has a history of bipolar disorder and sees psychiatry and psychologist. She returns for reevaluation.  August 17, 2018 SS: Visit via Web[employer]; 30 yo female with history of migraines. She is currently taking propanolol LA 60 mg daily. In the past she has failed Maxalt and Relpax, fiorcet. She was initially evaluated by Dr. Krista Blue in 2017 for migraine, she has history of h/a since elementary school. She has history of bipolar disorder, taking lamotrigine 300 mg at bedtime, Latuda. She hasclose follow-up with her psychiatrist.She was recently started on prazosinfor nightmares.She reports that her headaches have been doing well, but with recent stress with Covid-19she has been having more headaches, has had 3 headaches this week. Usually she would only have 4/month. She reports when she does get a headache she will take Tylenol and/or ibuprofen  and this does help with the pain. She has further stressed because she is a Charity fundraiser at [employer] and her school is currently out for the virus, will delay her graduation next year. She describes her  typical migraine headache as occurring either on the right side or left side of her head, phonophobia, dizziness, nausea, irritability. In the past she has tried Topamax but was unable to tolerate. She denies any changes to her medical history.  Update January 272021 SS:[name] via virtual visit for evaluation of migraine headache, since last seen in March, she reports her headaches have been doing well1-2 per month,but had different kind of headache last week, 4 days, and 2 days this week.She had increased stress last week, started school, she had an anxiety attack, developed spots in her vision, had headache to left side, was sharp pain,intermittently says she lost hearing in her left ear, hadblurry vision, altered sensation to her left mid face,she went to urgent care, was tested for Covid, was negative,has been taking ibuprofen.This week has headache on the right side, not as intense as last week.Is concerned that her headache pattern is changed, no slurred speech, gait change, or numbness or weakness in her arms or legs. She remains under the care of a psychiatrist, is prescribed Seroquel, Lamictal, BuSpar, prazosin.She remains on Inderal LA60 mg daily for migraine prevention.Her blood pressure remains on the lower end 110/75, heart rate 63. She now feels weak, run down from the headaches.She has not been able to tolerate Maxalt or Relpax previously,she has been treated with prednisone, said it made her not sleep.  Update September 21, 2019 SS: MRI of the brain in February 2021 was unremarkable.  Indicates her headaches have improved, she went to the eye doctor, was found to have an astigmatism, now has new glasses.  This is greatly helped, as she is doing a lot of work on the computer.  With headaches, reports 1-2 migraines a week.  She will take diclofenac with good benefit.  With headaches, she will see spots in her vision, before the headache.  Update January 18, 2020  SS: On average 1 migraine a week, used to be 3 times a week, headache may now be more intense, have nausea.  Taking Aimovig 70 mg monthly injection, Inderal LA 60 mg daily.  Excedrin Migraine works great for headache, diclofenac potassium not helpful.  Also taking Lamictal, Latuda, prazosin for mood disorder.  Notes dizziness, was fairly orthostatic with standing.  Has IBS, having more constipation with Aimovig. May see spots in her vision, without development of headache with Aimovig, eye exam was normal .  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 Headaches are about the same, often has vomiting with migraines. 1-2 migraines a week, 2 or 3 a month have vomiting. Feels better afterwards. Seeing GI for IBS, diarrhea. Only taking Inderal LA 60 mg daily. Insurance denied [employer]. Is off prazosin. Still sees psych. For acute headache, will drink water, then try crackers, single ibuprofen, then Excedrin. Feels shaking in the left hand, right hand is less. Reports thyroid function has been okay.  REVIEW OF SYSTEMS: Out of a complete 14 system review of symptoms, the patient complains only of the following symptoms, and all other reviewed systems are negative.  Headache  ALLERGIES: Allergies  Allergen Reactions  . Topiramate Other (See Comments)    suicidal thoughts  . Lexapro [Escitalopram Oxalate]     Worsened depression.  Marland Kitchen  Macrobid [Nitrofurantoin Macrocrystal] Rash    HOME MEDICATIONS: Outpatient Medications Prior to Visit  Medication Sig Dispense Refill  . b complex vitamins tablet Take 1 tablet by mouth daily.    . busPIRone (BUSPAR) 10 MG tablet Take 10 mg by mouth 2 (two) times daily.    . Cholecalciferol (VITAMIN D3) 125 MCG (5000 UT) CAPS Take 1 capsule by mouth daily.    . famotidine (PEPCID) 20 MG tablet Take 1 tablet (20 mg total) by mouth at bedtime. 30 tablet 1  . lamoTRIgine (LAMICTAL) 150 MG tablet Take 300 mg by mouth at  bedtime.    Marland Kitchen levonorgestrel (MIRENA) 20 MCG/24HR IUD 1 each by Intrauterine route once.    . loratadine (CLARITIN) 10 MG tablet Take 10 mg by mouth daily.    Marland Kitchen lurasidone (LATUDA) 40 MG TABS tablet Take 40 mg by mouth daily with breakfast.    . Probiotic Product (PROBIOTIC PO) Take 1 capsule by mouth daily.    Marland Kitchen Specialty Vitamins Products (MAGNESIUM, AMINO ACID CHELATE,) 133 MG tablet Take 1 tablet by mouth 2 (two) times daily.    . propranolol ER (INDERAL LA) 60 MG 24 hr capsule Take 1 capsule (60 mg total) by mouth daily. 30 capsule 5  . PAZEO 0.7 % SOLN INT 1 GTT IN OU QD PRN  5   No facility-administered medications prior to visit.    PAST MEDICAL HISTORY: Past Medical History:  Diagnosis Date  . Anxiety   . Bipolar 2 disorder ([place])   . Family history of breast cancer   . Genetic testing 03/18/2017   Multi-Cancer panel (83 genes) @ [facility] - No pathogenic mutations detected  . Migraine   . Mood disorder ([place])   . OSA (obstructive sleep apnea)    uses dental device    PAST SURGICAL HISTORY: Past Surgical History:  Procedure Laterality Date  . LAPAROSCOPIC OOPHERECTOMY Left   . WISDOM TOOTH EXTRACTION      FAMILY HISTORY: Family History  Problem Relation Age of Onset  . Breast cancer Mother 48       BSO 21; Neg genetic testing (17 genes BreastNext 2016)  . Breast cancer Paternal Grandmother 18       deceased 45s  . Hypercholesterolemia Paternal Grandmother   . Hypertension Paternal Grandfather   . Hypercholesterolemia Maternal Grandmother   . Thyroid cancer Maternal Grandmother 11       currently 44  . Heart disease Maternal Grandfather   . Ovarian cancer Maternal Aunt 8       currently 18  . Hypothyroidism Sister        in 1 sister  . Polycystic ovary syndrome Sister     SOCIAL HISTORY: Social History   Socioeconomic History  . Marital status: Divorced    Spouse name: Not on file  . Number of children: 0  . Years of education: Not on file  . Highest  education level: Not on file  Occupational History  . Occupation: IT    Comment: Investment banker, corporate  Tobacco Use  . Smoking status: Never Smoker  . Smokeless tobacco: Never Used  Vaping Use  . Vaping Use: Never used  Substance and Sexual Activity  . Alcohol use: No    Alcohol/week: 0.0 standard drinks  . Drug use: No  . Sexual activity: Yes    Partners: Male    Birth control/protection: I.U.D.  Other Topics Concern  . Not on file  Social History Narrative   Lives at home with  husband.   Right-handed.   No caffeine use.   Social Determinants of Health   Financial Resource Strain: Not on file  Food Insecurity: Not on file  Transportation Needs: Not on file  Physical Activity: Not on file  Stress: Not on file  Social Connections: Not on file  Intimate Partner Violence: Not on file   PHYSICAL EXAM  Vitals:   07/22/20 1416  BP: 100/60  Pulse: 74  Weight: 152 lb (68.9 kg)  Height: 5\' 10"  (1.778 m)   Body mass index is 21.81 kg/m. No data found. Generalized: Well developed, in no acute distress   Neurological examination  Mentation: Alert oriented to time, place, history taking. Follows all commands speech and language fluent Cranial nerve II-XII: Pupils were equal round reactive to light. Extraocular movements were full, visual field were full on confrontational test. Facial sensation and strength were normal. Head turning and shoulder shrug  were normal and symmetric. Motor: The motor testing reveals 5 over 5 strength of all 4 extremities. Good symmetric motor tone is noted throughout.  Sensory: Sensory testing is intact to soft touch on all 4 extremities. No evidence of extinction is noted.  Coordination: Cerebellar testing reveals good finger-nose-finger and heel-to-shin bilaterally. Mild tremor with finger nose finger bilaterally Gait and station: Gait is normal.  Reflexes: Deep tendon reflexes are symmetric and normal bilaterally.   DIAGNOSTIC DATA (LABS,  IMAGING, TESTING) - I reviewed patient records, labs, notes, testing and imaging myself where available.  Lab Results  Component Value Date   WBC 9.5 04/01/2020   HGB 13.6 04/01/2020   HCT 40.1 04/01/2020   MCV 96.2 04/01/2020   PLT 257.0 04/01/2020      Component Value Date/Time   NA 137 04/01/2020 1357   K 4.0 04/01/2020 1357   CL 101 04/01/2020 1357   CO2 28 04/01/2020 1357   GLUCOSE 84 04/01/2020 1357   BUN 7 04/01/2020 1357   CREATININE 0.72 04/01/2020 1357   CALCIUM 9.6 04/01/2020 1357   PROT 7.1 04/01/2020 1357   ALBUMIN 4.8 04/01/2020 1357   AST 15 04/01/2020 1357   ALT 12 04/01/2020 1357   ALKPHOS 86 04/01/2020 1357   BILITOT 0.6 04/01/2020 1357   No results found for: CHOL, HDL, LDLCALC, LDLDIRECT, TRIG, CHOLHDL No results found for: HGBA1C No results found for: VITAMINB12 Lab Results  Component Value Date   TSH 3.98 05/28/2020    ASSESSMENT AND PLAN 30 y.o. year old female  has a past medical history of Anxiety, Bipolar 2 disorder ([place]), Family history of breast cancer, Genetic testing (03/18/2017), Migraine, Mood disorder ([place]), and OSA (obstructive sleep apnea). here with:  1.  Chronic migraine headache  -On average 1 migraine weekly  -Side effect with Aimovig, insurance would not cover Emgality, previously tried and failed Topamax  -Continue Inderal LA 60 mg daily for migraine prevention, is now off prazosin  -History of mood disorder taking BuSpar, Lamictal, Latuda  -Previously not benefited from triptans, did not like the way it made her feel  -MRI of the brain in February 2021 was unremarkable  -Will try Nurtec for acute headache, can take Zofran if needed for nausea, already has Zofran on hand  -For tremor, will continue to follow, reportedly TSH has been unremarkable, medication side effect? More in the left than right, worse with stress/fatigue  -Follow-up in 6-8 months or sooner if needed with Dr. Krista Blue  I spent 30 minutes of face-to-face  and non-face-to-face time with patient.  This included  previsit chart review, lab review, study review, order entry, electronic health record documentation, patient education.  Butler Denmark, AGNP-C, DNP 07/22/2020, 2:48 PM [facility] [address] [place], [place] [address] [phone]

## 2020-07-22 NOTE — Patient Instructions (Signed)
Try Nurtec for acute headache, take 1 at onset of headache, max is 1 tablet in 24 hours Continue the Inderal LA 60 mg daily Can take your Zofran for nausea See you back in 6-8 months

## 2020-07-24 ENCOUNTER — Telehealth: Payer: Self-pay | Admitting: *Deleted

## 2020-07-24 NOTE — Telephone Encounter (Signed)
PA for Nurtec started on covermymeds (key: [id number]). Pt has coverage through [employer] (HL#[id number]. Decision pending.

## 2020-07-25 MED ORDER — UBRELVY 100 MG PO TABS: 100.0000 mg | ORAL_TABLET | ORAL | 11 refills | Status: DC | PRN

## 2020-07-25 NOTE — Telephone Encounter (Signed)
Received a denial for Nurtec. Her insurance plan prefers [place].

## 2020-07-25 NOTE — Telephone Encounter (Signed)
PA for Ubrelvy started on covermymeds (key: [id number]). Decision pending.  Pharmacy instructed to void previously Nurtec prescription. I also called the patient and left her a message with this update.

## 2020-07-25 NOTE — Addendum Note (Signed)
Addended by: Suzzanne Cloud on: 07/25/2020 11:20 AM   Modules accepted: Orders

## 2020-07-25 NOTE — Telephone Encounter (Signed)
Will d/c Nurtec, switch to [place]. Thanks

## 2020-07-29 MED ORDER — UBRELVY 100 MG PO TABS: 100.0000 mg | ORAL_TABLET | ORAL | 11 refills | Status: DC | PRN

## 2020-07-29 NOTE — Addendum Note (Signed)
Addended by: Desmond Lope on: 07/29/2020 03:42 PM   Modules accepted: Orders

## 2020-07-29 NOTE — Telephone Encounter (Signed)
PA approved through 07/25/2021 for #8 tablets per month. [id number]. Valid through 07/25/2021.

## 2020-08-08 NOTE — Telephone Encounter (Signed)
[name] PA on [facility], Key: [id number] was deleted.  See notes on this thread.  [name] has already been approved.

## 2020-08-26 ENCOUNTER — Other Ambulatory Visit: Payer: Self-pay | Admitting: Obstetrics and Gynecology

## 2020-08-26 NOTE — H&P (Signed)
30 y.o. G0 desires permanent sterilization.  Previously:"WWE. Pap done. Mom breast ca at 9- mammos starts 10 yo.Marland Kitchen HCS tool POS- testing done. Pt is sure that she does not want children ever. Pt on significant meds and is bipolar and concerned for health risks with pregnancy. Pt is sure. Knows permanent. D/w pt removal of tubes would reduce lifetime risk of ovarian ca. Wait for results of my risk test before booking. Think also about periods and what they would be like after mirena out. May keep IUD. May not need tube removal. If keeps IUD- would do with US guidance and lidocaine. Finishing out HPV vaccine. Pt had bad nausea from surgery in past. Consider Tramadol post op. "  Pt has thought about it but desires permanent sterilization and will do salpingectomies for hx of ovarian ca."  My Risk testing results were :2022-Pt is negative for any gene mutations but her lifetime risk of breast cancer is 51%%. Her 5 year risk is 0.4% and her earliest relatives were under age 73 (mom at 21) We discussed: -doing frequent breast exams and mammograms starting at age 63. -the possibility of adding breast MRIs starting at 39 years younger than the earliest relative. -that MRI may be able to pick up earlier cancers but may also have more false positives and lead to more testing and biopsies. Pt voiced understanding and all questions answered. I spent at least 15 minutes face to face with patient. Pt will do MRIs.  Pt does not have mutations or increased risk by fm for ovarian ca.  Past Medical History:  Diagnosis Date  . Anxiety   . Bipolar 2 disorder ([place])   . Family history of breast cancer   . Genetic testing 03/18/2017   Multi-Cancer panel (83 genes) @ [facility] - No pathogenic mutations detected  . Migraine   . Mood disorder ([place])   . OSA (obstructive sleep apnea)    uses dental device   Past Surgical History:  Procedure Laterality Date  . LAPAROSCOPIC OOPHERECTOMY Left   . WISDOM TOOTH  EXTRACTION      Social History   Socioeconomic History  . Marital status: Divorced    Spouse name: Not on file  . Number of children: 0  . Years of education: Not on file  . Highest education level: Not on file  Occupational History  . Occupation: IT    Comment: Investment banker, corporate  Tobacco Use  . Smoking status: Never Smoker  . Smokeless tobacco: Never Used  Vaping Use  . Vaping Use: Never used  Substance and Sexual Activity  . Alcohol use: No    Alcohol/week: 0.0 standard drinks  . Drug use: No  . Sexual activity: Yes    Partners: Male    Birth control/protection: I.U.D.  Other Topics Concern  . Not on file  Social History Narrative   Lives at home with husband.   Right-handed.   No caffeine use.   Social Determinants of Health   Financial Resource Strain: Not on file  Food Insecurity: Not on file  Transportation Needs: Not on file  Physical Activity: Not on file  Stress: Not on file  Social Connections: Not on file  Intimate Partner Violence: Not on file    No current facility-administered medications on file prior to encounter.   Current Outpatient Medications on File Prior to Encounter  Medication Sig Dispense Refill  . b complex vitamins tablet Take 1 tablet by mouth daily.    . busPIRone (BUSPAR) 10 MG  tablet Take 10 mg by mouth 2 (two) times daily.    . Cholecalciferol (VITAMIN D3) 125 MCG (5000 UT) CAPS Take 1 capsule by mouth daily.    . famotidine (PEPCID) 20 MG tablet Take 1 tablet (20 mg total) by mouth at bedtime. 30 tablet 1  . lamoTRIgine (LAMICTAL) 150 MG tablet Take 300 mg by mouth at bedtime.    Marland Kitchen levonorgestrel (MIRENA) 20 MCG/24HR IUD 1 each by Intrauterine route once.    . loratadine (CLARITIN) 10 MG tablet Take 10 mg by mouth daily.    Marland Kitchen lurasidone (LATUDA) 40 MG TABS tablet Take 40 mg by mouth daily with breakfast.    . PAZEO 0.7 % SOLN INT 1 GTT IN OU QD PRN  5  . Probiotic Product (PROBIOTIC PO) Take 1 capsule by mouth daily.    .  propranolol ER (INDERAL LA) 60 MG 24 hr capsule Take 1 capsule (60 mg total) by mouth daily. 90 capsule 3  . Specialty Vitamins Products (MAGNESIUM, AMINO ACID CHELATE,) 133 MG tablet Take 1 tablet by mouth 2 (two) times daily.    Marland Kitchen Ubrogepant (UBRELVY) 100 MG TABS Take 100 mg by mouth as needed (Take 1 at onset of headache, may repeat 2 hours later, max is 200 mg in 24 hours). 8 tablet 11    Allergies  Allergen Reactions  . Topiramate Other (See Comments)    suicidal thoughts  . Lexapro [Escitalopram Oxalate]     Worsened depression.  Santiago Bur [Nitrofurantoin Macrocrystal] Rash    There were no vitals filed for this visit.  Lungs: clear to ascultation Cor:  RRR Abdomen:  soft, nontender, nondistended. Ex:  no cords, erythema Pelvic:   Vulva: no masses, no atrophy, no lesions Vagina: no tenderness, no erythema, no abnormal vaginal discharge, no vesicle(s) or ulcers, no cystocele, no rectocele Cervix: grossly normal, no discharge, no cervical motion tenderness Uterus: normal size (7), normal shape, midline, no uterine prolapse, non-tender Bladder/Urethra: normal meatus, no urethral discharge, no urethral mass, bladder non distended, Urethra well supported Adnexa/Parametria: no parametrial tenderness, no parametrial mass, no adnexal tenderness, no ovarian mass  A:  Pt desires permanent sterilization secondary medical considerations and risks with pregnancy.  She desires bilateral salpingectomies.    P: P: All risks, benefits and alternatives d/w patient and she desires to proceed.  Patient has undergone  ERAS protocol and will receive SCDs during the operation.  Daria Pastures

## 2020-08-29 ENCOUNTER — Other Ambulatory Visit: Payer: Self-pay

## 2020-08-29 ENCOUNTER — Encounter (HOSPITAL_BASED_OUTPATIENT_CLINIC_OR_DEPARTMENT_OTHER): Payer: Self-pay | Admitting: Obstetrics and Gynecology

## 2020-08-29 NOTE — Progress Notes (Addendum)
Spoke w/ via phone for pre-op interview---pt Lab needs dos---- cbc, urine preg              Lab results------none COVID test ------ Arrive at -------09-02-2020 1000 am NPO after MN NO Solid Food.  Clear liquids from MN until---530 am then npo Med rec completed Medications to take morning of surgery -----pt wishes to take no meds am of surgery due to takes meds at 1100 am each day Diabetic medication ---n/a Patient instructed to bring photo id and insurance card day of surgery Patient aware to have Driver (ride ) / caregiver  Boyfriend kacper lewtek cell [phone]    for 24 hours after surgery  Patient Special Instructions -----none Pre-Op special Istructions -----none Patient verbalized understanding of instructions that were given at this phone interview. Patient denies shortness of breath, chest pain, fever, cough at this phone interview.

## 2020-09-02 ENCOUNTER — Other Ambulatory Visit (HOSPITAL_COMMUNITY)
Admission: RE | Admit: 2020-09-02 | Discharge: 2020-09-02 | Disposition: A | Discharge status: Home / Self Care | Payer: 59 | Source: Ambulatory Visit | Attending: Obstetrics and Gynecology | Admitting: Obstetrics and Gynecology

## 2020-09-02 DIAGNOSIS — Z01812 Encounter for preprocedural laboratory examination: Secondary | ICD-10-CM | POA: Insufficient documentation

## 2020-09-02 DIAGNOSIS — Z20822 Contact with and (suspected) exposure to covid-19: Secondary | ICD-10-CM | POA: Insufficient documentation

## 2020-09-02 LAB — SARS CORONAVIRUS 2 (TAT 6-24 HRS): SARS Coronavirus 2: NEGATIVE

## 2020-09-04 ENCOUNTER — Encounter (HOSPITAL_BASED_OUTPATIENT_CLINIC_OR_DEPARTMENT_OTHER): Payer: Self-pay | Admitting: Obstetrics and Gynecology

## 2020-09-04 ENCOUNTER — Other Ambulatory Visit: Payer: Self-pay

## 2020-09-04 ENCOUNTER — Ambulatory Visit (HOSPITAL_BASED_OUTPATIENT_CLINIC_OR_DEPARTMENT_OTHER): Payer: 59 | Admitting: Anesthesiology

## 2020-09-04 ENCOUNTER — Ambulatory Visit (HOSPITAL_BASED_OUTPATIENT_CLINIC_OR_DEPARTMENT_OTHER)
Admission: RE | Admit: 2020-09-04 | Discharge: 2020-09-04 | Disposition: A | Discharge status: Home / Self Care | Payer: 59 | Attending: Obstetrics and Gynecology | Admitting: Obstetrics and Gynecology

## 2020-09-04 ENCOUNTER — Ambulatory Visit: Payer: 59 | Admitting: Nurse Practitioner

## 2020-09-04 ENCOUNTER — Encounter (HOSPITAL_BASED_OUTPATIENT_CLINIC_OR_DEPARTMENT_OTHER)
Admission: RE | Disposition: A | Discharge status: Home / Self Care | Payer: Self-pay | Source: Home / Self Care | Attending: Obstetrics and Gynecology

## 2020-09-04 DIAGNOSIS — Z888 Allergy status to other drugs, medicaments and biological substances status: Secondary | ICD-10-CM | POA: Diagnosis not present

## 2020-09-04 DIAGNOSIS — Z793 Long term (current) use of hormonal contraceptives: Secondary | ICD-10-CM | POA: Insufficient documentation

## 2020-09-04 DIAGNOSIS — Z881 Allergy status to other antibiotic agents status: Secondary | ICD-10-CM | POA: Insufficient documentation

## 2020-09-04 DIAGNOSIS — Z803 Family history of malignant neoplasm of breast: Secondary | ICD-10-CM | POA: Diagnosis not present

## 2020-09-04 DIAGNOSIS — Z79899 Other long term (current) drug therapy: Secondary | ICD-10-CM | POA: Diagnosis not present

## 2020-09-04 DIAGNOSIS — Z302 Encounter for sterilization: Secondary | ICD-10-CM | POA: Diagnosis present

## 2020-09-04 HISTORY — DX: Family history of other specified conditions: Z84.89

## 2020-09-04 HISTORY — DX: Scoliosis, unspecified: M41.9

## 2020-09-04 HISTORY — PX: LAPAROSCOPIC BILATERAL SALPINGECTOMY: SHX5889

## 2020-09-04 HISTORY — DX: Depression, unspecified: F32.A

## 2020-09-04 LAB — CBC
HCT: 41.2 % (ref 36.0–46.0)
Hemoglobin: 13.7 g/dL (ref 12.0–15.0)
MCH: 33.2 pg (ref 26.0–34.0)
MCHC: 33.3 g/dL (ref 30.0–36.0)
MCV: 99.8 fL (ref 80.0–100.0)
Platelets: 246 10*3/uL (ref 150–400)
RBC: 4.13 MIL/uL (ref 3.87–5.11)
RDW: 11.6 % (ref 11.5–15.5)
WBC: 8.5 10*3/uL (ref 4.0–10.5)
nRBC: 0 % (ref 0.0–0.2)

## 2020-09-04 LAB — POCT PREGNANCY, URINE: Preg Test, Ur: NEGATIVE

## 2020-09-04 SURGERY — SALPINGECTOMY, BILATERAL, LAPAROSCOPIC
Anesthesia: General | Site: Abdomen | Laterality: Bilateral

## 2020-09-04 MED ORDER — LACTATED RINGERS IV SOLN: INTRAVENOUS | Status: DC

## 2020-09-04 MED ORDER — FENTANYL CITRATE (PF) 250 MCG/5ML IJ SOLN
INTRAMUSCULAR | Status: AC
  Filled 2020-09-04: qty 5

## 2020-09-04 MED ORDER — ACETAMINOPHEN 500 MG PO TABS: 1000.0000 mg | ORAL_TABLET | Freq: Once | ORAL | Status: DC

## 2020-09-04 MED ORDER — ACETAMINOPHEN 500 MG PO TABS
ORAL_TABLET | ORAL | Status: AC
  Filled 2020-09-04: qty 2

## 2020-09-04 MED ORDER — ARTIFICIAL TEARS OPHTHALMIC OINT
TOPICAL_OINTMENT | OPHTHALMIC | Status: AC
  Filled 2020-09-04: qty 3.5

## 2020-09-04 MED ORDER — DEXMEDETOMIDINE (PRECEDEX) IN NS 20 MCG/5ML (4 MCG/ML) IV SYRINGE
PREFILLED_SYRINGE | INTRAVENOUS | Status: AC
  Filled 2020-09-04: qty 5

## 2020-09-04 MED ORDER — ACETAMINOPHEN 10 MG/ML IV SOLN
INTRAVENOUS | Status: DC | PRN
  Administered 2020-09-04: 1000 mg via INTRAVENOUS

## 2020-09-04 MED ORDER — KETOROLAC TROMETHAMINE 30 MG/ML IJ SOLN
30.0000 mg | Freq: Once | INTRAMUSCULAR | Status: AC | PRN
  Administered 2020-09-04: 30 mg via INTRAVENOUS

## 2020-09-04 MED ORDER — PROMETHAZINE HCL 25 MG/ML IJ SOLN: 6.2500 mg | INTRAMUSCULAR | Status: DC | PRN

## 2020-09-04 MED ORDER — SUGAMMADEX SODIUM 200 MG/2ML IV SOLN
INTRAVENOUS | Status: DC | PRN
  Administered 2020-09-04: 200 mg via INTRAVENOUS

## 2020-09-04 MED ORDER — MIDAZOLAM HCL 2 MG/2ML IJ SOLN
INTRAMUSCULAR | Status: DC | PRN
  Administered 2020-09-04: 2 mg via INTRAVENOUS

## 2020-09-04 MED ORDER — ROCURONIUM BROMIDE 10 MG/ML (PF) SYRINGE
PREFILLED_SYRINGE | INTRAVENOUS | Status: AC
  Filled 2020-09-04: qty 10

## 2020-09-04 MED ORDER — AMISULPRIDE (ANTIEMETIC) 5 MG/2ML IV SOLN: 10.0000 mg | Freq: Once | INTRAVENOUS | Status: DC | PRN

## 2020-09-04 MED ORDER — SOD CITRATE-CITRIC ACID 500-334 MG/5ML PO SOLN: 30.0000 mL | ORAL | Status: DC

## 2020-09-04 MED ORDER — GLYCOPYRROLATE PF 0.2 MG/ML IJ SOSY
PREFILLED_SYRINGE | INTRAMUSCULAR | Status: DC | PRN
  Administered 2020-09-04: .2 mg via INTRAVENOUS

## 2020-09-04 MED ORDER — ROCURONIUM BROMIDE 10 MG/ML (PF) SYRINGE
PREFILLED_SYRINGE | INTRAVENOUS | Status: DC | PRN
  Administered 2020-09-04: 40 mg via INTRAVENOUS

## 2020-09-04 MED ORDER — POVIDONE-IODINE 10 % EX SWAB: 2.0000 "application " | Freq: Once | CUTANEOUS | Status: DC

## 2020-09-04 MED ORDER — ACETAMINOPHEN 10 MG/ML IV SOLN
INTRAVENOUS | Status: AC
  Filled 2020-09-04: qty 100

## 2020-09-04 MED ORDER — MIDAZOLAM HCL 2 MG/2ML IJ SOLN
INTRAMUSCULAR | Status: AC
  Filled 2020-09-04: qty 2

## 2020-09-04 MED ORDER — FENTANYL CITRATE (PF) 100 MCG/2ML IJ SOLN: 25.0000 ug | INTRAMUSCULAR | Status: DC | PRN

## 2020-09-04 MED ORDER — SCOPOLAMINE 1 MG/3DAYS TD PT72
1.0000 | MEDICATED_PATCH | TRANSDERMAL | Status: DC
  Administered 2020-09-04: 1.5 mg via TRANSDERMAL

## 2020-09-04 MED ORDER — PROPOFOL 10 MG/ML IV BOLUS
INTRAVENOUS | Status: DC | PRN
  Administered 2020-09-04: 200 mg via INTRAVENOUS

## 2020-09-04 MED ORDER — DEXAMETHASONE SODIUM PHOSPHATE 10 MG/ML IJ SOLN
INTRAMUSCULAR | Status: AC
  Filled 2020-09-04: qty 1

## 2020-09-04 MED ORDER — EPHEDRINE 5 MG/ML INJ
INTRAVENOUS | Status: AC
  Filled 2020-09-04: qty 10

## 2020-09-04 MED ORDER — PROPOFOL 10 MG/ML IV BOLUS
INTRAVENOUS | Status: AC
  Filled 2020-09-04: qty 20

## 2020-09-04 MED ORDER — SODIUM CHLORIDE 0.9 % IV SOLN
INTRAVENOUS | Status: DC | PRN
  Administered 2020-09-04: 60 mL

## 2020-09-04 MED ORDER — OXYCODONE HCL 5 MG/5ML PO SOLN: 5.0000 mg | Freq: Once | ORAL | Status: DC | PRN

## 2020-09-04 MED ORDER — DEXMEDETOMIDINE (PRECEDEX) IN NS 20 MCG/5ML (4 MCG/ML) IV SYRINGE
PREFILLED_SYRINGE | INTRAVENOUS | Status: DC | PRN
  Administered 2020-09-04 (×4): 4 ug via INTRAVENOUS

## 2020-09-04 MED ORDER — ONDANSETRON HCL 4 MG/2ML IJ SOLN
INTRAMUSCULAR | Status: AC
  Filled 2020-09-04: qty 2

## 2020-09-04 MED ORDER — SCOPOLAMINE 1 MG/3DAYS TD PT72
MEDICATED_PATCH | TRANSDERMAL | Status: AC
  Filled 2020-09-04: qty 1

## 2020-09-04 MED ORDER — OXYCODONE HCL 5 MG PO TABS: 5.0000 mg | ORAL_TABLET | Freq: Once | ORAL | Status: DC | PRN

## 2020-09-04 MED ORDER — EPHEDRINE SULFATE-NACL 50-0.9 MG/10ML-% IV SOSY
PREFILLED_SYRINGE | INTRAVENOUS | Status: DC | PRN
  Administered 2020-09-04: 10 mg via INTRAVENOUS

## 2020-09-04 MED ORDER — FENTANYL CITRATE (PF) 100 MCG/2ML IJ SOLN
INTRAMUSCULAR | Status: DC | PRN
  Administered 2020-09-04: 100 ug via INTRAVENOUS
  Administered 2020-09-04: 50 ug via INTRAVENOUS

## 2020-09-04 MED ORDER — KETOROLAC TROMETHAMINE 30 MG/ML IJ SOLN
INTRAMUSCULAR | Status: AC
  Filled 2020-09-04: qty 1

## 2020-09-04 MED ORDER — ONDANSETRON HCL 4 MG/2ML IJ SOLN
INTRAMUSCULAR | Status: DC | PRN
  Administered 2020-09-04: 4 mg via INTRAVENOUS

## 2020-09-04 MED ORDER — GLYCOPYRROLATE PF 0.2 MG/ML IJ SOSY
PREFILLED_SYRINGE | INTRAMUSCULAR | Status: AC
  Filled 2020-09-04: qty 1

## 2020-09-04 MED ORDER — DEXAMETHASONE SODIUM PHOSPHATE 10 MG/ML IJ SOLN
INTRAMUSCULAR | Status: DC | PRN
  Administered 2020-09-04: 10 mg via INTRAVENOUS

## 2020-09-04 MED ORDER — OXYCODONE-ACETAMINOPHEN 5-325 MG PO TABS: 1.0000 | ORAL_TABLET | ORAL | 0 refills | Status: DC | PRN

## 2020-09-04 MED ORDER — LIDOCAINE 2% (20 MG/ML) 5 ML SYRINGE
INTRAMUSCULAR | Status: DC | PRN
  Administered 2020-09-04: 40 mg via INTRAVENOUS

## 2020-09-04 MED ORDER — LIDOCAINE 2% (20 MG/ML) 5 ML SYRINGE
INTRAMUSCULAR | Status: AC
  Filled 2020-09-04: qty 5

## 2020-09-04 SURGICAL SUPPLY — 41 items
ADH SKN CLS APL DERMABOND .7 (GAUZE/BANDAGES/DRESSINGS) ×1
BAG SPEC RTRVL LRG 6X4 10 (ENDOMECHANICALS)
CABLE HIGH FREQUENCY MONO STRZ (ELECTRODE) IMPLANT
CANISTER SUCT 3000ML PPV (MISCELLANEOUS) IMPLANT
CATH ROBINSON RED A/P 16FR (CATHETERS) ×2 IMPLANT
COVER MAYO STAND STRL (DRAPES) ×2 IMPLANT
COVER WAND RF STERILE (DRAPES) ×2 IMPLANT
DERMABOND ADVANCED (GAUZE/BANDAGES/DRESSINGS) ×1
DERMABOND ADVANCED .7 DNX12 (GAUZE/BANDAGES/DRESSINGS) ×1 IMPLANT
DRSG COVADERM PLUS 2X2 (GAUZE/BANDAGES/DRESSINGS) ×2 IMPLANT
DURAPREP 26ML APPLICATOR (WOUND CARE) ×2 IMPLANT
GAUZE 4X4 16PLY RFD (DISPOSABLE) ×2 IMPLANT
GLOVE SURG ENC MOIS LTX SZ7 (GLOVE) ×2 IMPLANT
GOWN STRL REUS W/TWL LRG LVL3 (GOWN DISPOSABLE) ×2 IMPLANT
KIT TURNOVER CYSTO (KITS) ×2 IMPLANT
LIGASURE VESSEL 5MM BLUNT TIP (ELECTROSURGICAL) ×2 IMPLANT
NEEDLE HYPO 22GX1.5 SAFETY (NEEDLE) IMPLANT
NEEDLE INSUFFLATION 120MM (ENDOMECHANICALS) ×2 IMPLANT
NEEDLE INSUFFLATION 14GA 150MM (NEEDLE) IMPLANT
NS IRRIG 500ML POUR BTL (IV SOLUTION) ×2 IMPLANT
PACK LAPAROSCOPY BASIN (CUSTOM PROCEDURE TRAY) ×2 IMPLANT
PACK TRENDGUARD 450 HYBRID PRO (MISCELLANEOUS) ×1 IMPLANT
PAD OB MATERNITY 4.3X12.25 (PERSONAL CARE ITEMS) ×2 IMPLANT
POUCH SPECIMEN RETRIEVAL 10MM (ENDOMECHANICALS) IMPLANT
SCISSORS LAP 5X35 DISP (ENDOMECHANICALS) IMPLANT
SEALER TISSUE G2 CVD JAW 35 (ENDOMECHANICALS) IMPLANT
SEALER TISSUE G2 CVD JAW 45CM (ENDOMECHANICALS)
SET SUCTION IRRIG HYDROSURG (IRRIGATION / IRRIGATOR) IMPLANT
SET TUBE SMOKE EVAC HIGH FLOW (TUBING) ×2 IMPLANT
SOLUTION ELECTROLUBE (MISCELLANEOUS) IMPLANT
SUT VICRYL 0 UR6 27IN ABS (SUTURE) ×2 IMPLANT
SUT VICRYL RAPIDE 3 0 (SUTURE) IMPLANT
SUT VICRYL RAPIDE 4/0 PS 2 (SUTURE) ×4 IMPLANT
SYR 50ML LL SCALE MARK (SYRINGE) ×2 IMPLANT
TOWEL OR 17X26 10 PK STRL BLUE (TOWEL DISPOSABLE) ×2 IMPLANT
TRENDGUARD 450 HYBRID PRO PACK (MISCELLANEOUS) ×2
TROCAR DILATING TIP 12MM 150MM (ENDOMECHANICALS) IMPLANT
TROCAR OPTI TIP 5M 100M (ENDOMECHANICALS) ×4 IMPLANT
TROCAR XCEL DIL TIP R 11M (ENDOMECHANICALS) ×2 IMPLANT
WARMER LAPAROSCOPE (MISCELLANEOUS) ×2 IMPLANT
WATER STERILE IRR 500ML POUR (IV SOLUTION) ×2 IMPLANT

## 2020-09-04 NOTE — Anesthesia Postprocedure Evaluation (Signed)
Anesthesia Post Note  Patient: Katelyn King  Procedure(s) Performed: LAPAROSCOPIC BILATERAL SALPINGECTOMY, REMOVAL OF IUD (Bilateral Abdomen)     Patient location during evaluation: PACU Anesthesia Type: General Level of consciousness: awake Pain management: pain level controlled Vital Signs Assessment: post-procedure vital signs reviewed and stable Respiratory status: spontaneous breathing, nonlabored ventilation, respiratory function stable and patient connected to nasal cannula oxygen Cardiovascular status: blood pressure returned to baseline and stable Postop Assessment: no apparent nausea or vomiting Anesthetic complications: no   No complications documented.  Last Vitals:  Vitals:   09/04/20 1015 09/04/20 1055  BP: 111/60 100/71  Pulse: 60 63  Resp: 17 16  Temp:  36.4 C  SpO2: 96% 100%    Last Pain:  Vitals:   09/04/20 1055  TempSrc:   PainSc: 1                  Katelyn King P Hani Campusano

## 2020-09-04 NOTE — Transfer of Care (Signed)
Immediate Anesthesia Transfer of Care Note  Patient: Katelyn King  Procedure(s) Performed: Procedure(s) (LRB): LAPAROSCOPIC BILATERAL SALPINGECTOMY, REMOVAL OF IUD (Bilateral)  Patient Location: PACU  Anesthesia Type: General  Level of Consciousness: awake, alert  and oriented  Airway & Oxygen Therapy: Patient Spontanous Breathing and Patient connected to nasal cannula oxygen  Post-op Assessment: Report given to PACU RN and Post -op Vital signs reviewed and stable  Post vital signs: Reviewed and stable  Complications: No apparent anesthesia complications Last Vitals:  Vitals Value Taken Time  BP 110/70 09/04/20 0945  Temp    Pulse 67 09/04/20 0947  Resp 19 09/04/20 0947  SpO2 97 % 09/04/20 0947  Vitals shown include unvalidated device data.  Last Pain:  Vitals:   09/04/20 0646  TempSrc: Oral         Complications: No complications documented.

## 2020-09-04 NOTE — Progress Notes (Signed)
There has been no change in the patients history, status or exam since the history and physical.  Vitals:   08/29/20 1004 09/04/20 0646  BP:  107/72  Pulse:  65  Resp:  15  Temp:  97.9 F (36.6 C)  TempSrc:  Oral  SpO2:  99%  Weight: 70.3 kg 70.5 kg  Height: 5\' 10"  (1.778 m) 5\' 10"  (1.778 m)    Results for orders placed or performed during the hospital encounter of 09/04/20 (from the past 72 hour(s))  Pregnancy, urine POC     Status: None   Collection Time: 09/04/20  6:44 AM  Result Value Ref Range   Preg Test, Ur NEGATIVE NEGATIVE    Comment:        THE SENSITIVITY OF THIS METHODOLOGY IS >24 mIU/mL   CBC     Status: None   Collection Time: 09/04/20  7:30 AM  Result Value Ref Range   WBC 8.5 4.0 - 10.5 K/uL   RBC 4.13 3.87 - 5.11 MIL/uL   Hemoglobin 13.7 12.0 - 15.0 g/dL   HCT 41.2 36.0 - 46.0 %   MCV 99.8 80.0 - 100.0 fL   MCH 33.2 26.0 - 34.0 pg   MCHC 33.3 30.0 - 36.0 g/dL   RDW 11.6 11.5 - 15.5 %   Platelets 246 150 - 400 K/uL   nRBC 0.0 0.0 - 0.2 %    Comment: Performed at [facility], [place] [address], [place], [place] [address]    Daria Pastures

## 2020-09-04 NOTE — Anesthesia Procedure Notes (Signed)
Procedure Name: Intubation Date/Time: 09/04/2020 8:38 AM Performed by: Mechele Claude, CRNA Pre-anesthesia Checklist: Patient identified, Emergency Drugs available, Suction available and Patient being monitored Patient Re-evaluated:Patient Re-evaluated prior to induction Oxygen Delivery Method: Circle system utilized Preoxygenation: Pre-oxygenation with 100% oxygen Induction Type: IV induction Ventilation: Mask ventilation without difficulty Laryngoscope Size: Mac and 4 Grade View: Grade I Tube type: Oral Number of attempts: 1 Airway Equipment and Method: Stylet and Oral airway Placement Confirmation: ETT inserted through vocal cords under direct vision,  positive ETCO2 and breath sounds checked- equal and bilateral Secured at: 21 cm Tube secured with: Tape Dental Injury: Teeth and Oropharynx as per pre-operative assessment

## 2020-09-04 NOTE — Progress Notes (Signed)
Patient was consented in office for IUD removal in OR before salpingectomies.  This was not explicitly on the consent form but I verify that the patient consented to the IUD removal verbally prior to the procedure.

## 2020-09-04 NOTE — Op Note (Signed)
09/04/2020  9:26 AM  PATIENT:  Katelyn King  30 y.o. female  PRE-OPERATIVE DIAGNOSIS:  Sterilization  POST-OPERATIVE DIAGNOSIS:  Sterilization  PROCEDURE:  Procedure(s): LAPAROSCOPIC BILATERAL SALPINGECTOMY, REMOVAL OF IUD (Bilateral)  SURGEON:  Surgeon(s) and Role:    * Bobbye Charleston, MD - Primary    * Rowland Lathe, MD - Assisting  ANESTHESIA:   general  EBL:  10 mL   LOCAL MEDICATIONS USED:  OTHER Ropivicaine  SPECIMEN:  Source of Specimen:  bilateral tubes  DISPOSITION OF SPECIMEN:  PATHOLOGY  COUNTS:  YES  TOURNIQUET:  * No tourniquets in log *  DICTATION: .Note written in EPIC  PLAN OF CARE: Discharge to home after PACU  PATIENT DISPOSITION:  PACU - hemodynamically stable.   Delay start of Pharmacological VTE agent (>24hrs) due to surgical blood loss or risk of bleeding: not applicable           Complications: none  Findings: L tube intact, L ovary mostly removed but some residual tissue seen.  RO and uterus were normal.  Liver edge and appendix appeared normal.     Technique: After adequate general anesthesia was achieved, the patient was prepped and draped in the usual fashion.  A hulka was placed in the cervix and secured.  The bladder was drained with a catheter.  A two cm incision was made below the umbilicus and the veress needle passed inside without aspiration of bowel contents or blood.  The 10 mm trocar was placed without comp after the abdomin had been insufflated.  The camera was placed inside and two 5 mm trocars were placed 3 above the iliac crests under direct visualization of the camera.  A careful and systematic exploration was performed and the above findings were noted.   An omental-anterior abdominal adhesion was removed with the ligasure.  The L tube was adhesed to the sidewall under the decending sigmoid and the sigmoid was released with the ligasure careful to stay far from the bowel.  Each tube was tented up  and the ligasure used to cauterize and cut the mesosalpinx.  Each tube was then transected with the ligasure close to the cornua.  Ropivicaine was placed.  The 12 mm trocar fascia was closed with a figure of eight stitch and the skin incisions closed with subq 3-0vicryl R.  The incisions were closed with dermabond.  The patient tolerated the procedure well and was returned to the recovery room in stable condition.    Yanna Leaks A

## 2020-09-04 NOTE — Anesthesia Preprocedure Evaluation (Addendum)
Anesthesia Evaluation  Patient identified by MRN, date of birth, ID band Patient awake    Reviewed: Allergy & Precautions, NPO status , Patient's Chart, lab work & pertinent test results  Airway Mallampati: II  TM Distance: >3 FB Neck ROM: Full    Dental no notable dental hx.    Pulmonary sleep apnea ,    Pulmonary exam normal breath sounds clear to auscultation       Cardiovascular negative cardio ROS Normal cardiovascular exam Rhythm:Regular Rate:Normal     Neuro/Psych  Headaches, PSYCHIATRIC DISORDERS Anxiety Depression Bipolar Disorder    GI/Hepatic negative GI ROS, Neg liver ROS,   Endo/Other  negative endocrine ROS  Renal/GU negative Renal ROS     Musculoskeletal negative musculoskeletal ROS (+)   Abdominal   Peds  Hematology negative hematology ROS (+)   Anesthesia Other Findings Desires Sterilization  Reproductive/Obstetrics hcg negative                            Anesthesia Physical Anesthesia Plan  ASA: II  Anesthesia Plan: General   Post-op Pain Management:    Induction: Intravenous  PONV Risk Score and Plan: 4 or greater and Ondansetron, Dexamethasone, Midazolam, Scopolamine patch - Pre-op and Treatment may vary due to age or medical condition  Airway Management Planned: Oral ETT  Additional Equipment:   Intra-op Plan:   Post-operative Plan: Extubation in OR  Informed Consent: I have reviewed the patients History and Physical, chart, labs and discussed the procedure including the risks, benefits and alternatives for the proposed anesthesia with the patient or authorized representative who has indicated his/her understanding and acceptance.     Dental advisory given  Plan Discussed with: CRNA  Anesthesia Plan Comments:        Anesthesia Quick Evaluation

## 2020-09-04 NOTE — Brief Op Note (Signed)
09/04/2020  9:26 AM  PATIENT:  Katelyn King  30 y.o. female  PRE-OPERATIVE DIAGNOSIS:  Sterilization  POST-OPERATIVE DIAGNOSIS:  Sterilization  PROCEDURE:  Procedure(s): LAPAROSCOPIC BILATERAL SALPINGECTOMY, REMOVAL OF IUD (Bilateral)  SURGEON:  Surgeon(s) and Role:    * Bobbye Charleston, MD - Primary    * Rowland Lathe, MD - Assisting  ANESTHESIA:   general  EBL:  10 mL   LOCAL MEDICATIONS USED:  OTHER Ropivicaine  SPECIMEN:  Source of Specimen:  bilateral tubes  DISPOSITION OF SPECIMEN:  PATHOLOGY  COUNTS:  YES  TOURNIQUET:  * No tourniquets in log *  DICTATION: .Note written in EPIC  PLAN OF CARE: Discharge to home after PACU  PATIENT DISPOSITION:  PACU - hemodynamically stable.   Delay start of Pharmacological VTE agent (>24hrs) due to surgical blood loss or risk of bleeding: not applicable

## 2020-09-04 NOTE — Discharge Instructions (Signed)

## 2020-09-05 ENCOUNTER — Encounter (HOSPITAL_BASED_OUTPATIENT_CLINIC_OR_DEPARTMENT_OTHER): Payer: Self-pay | Admitting: Obstetrics and Gynecology

## 2020-09-05 LAB — SURGICAL PATHOLOGY

## 2020-09-11 ENCOUNTER — Other Ambulatory Visit: Payer: Self-pay

## 2020-09-11 DIAGNOSIS — F319 Bipolar disorder, unspecified: Secondary | ICD-10-CM | POA: Insufficient documentation

## 2020-09-11 DIAGNOSIS — F329 Major depressive disorder, single episode, unspecified: Secondary | ICD-10-CM | POA: Insufficient documentation

## 2020-09-11 DIAGNOSIS — E559 Vitamin D deficiency, unspecified: Secondary | ICD-10-CM | POA: Insufficient documentation

## 2020-09-11 DIAGNOSIS — F0781 Postconcussional syndrome: Secondary | ICD-10-CM | POA: Insufficient documentation

## 2020-09-16 NOTE — Progress Notes (Signed)
09/16/2020 Katelyn King [id number] 11/09/1990   Chief Complaint: Nausea, vomiting   History of Present Illness: Katelyn King is a 30 year old female with a past medical history of anxiety, bipolar disorder, PTSD, migraine headachesand OSA uses a dental appliance. S/P laparoscopic bilateral salpingectomy and removal of IUD (elective sterilization surgery) on 09/04/2020.  I last saw Katelyn King in office on 04/25/2021 due to having nausea and vomiting episodes. An abdominal sonogram 02/22/2020 was negative. Normal am cortisol level. As previously reviewed, EGD was done 04/2019 by [facility] which showed inactive gastritis with incidental pancreatic metaplasia. I prescribed Famotidine 20mg  QD and FDgard 2 po bid without significant improvement. She presents today for further GI follow up. She continues to have random nausea which occurs about twice weekly with less frequent vomiting. She last vomited partially digested food which she ae 8 hours earlier 3 weeks ago. No hematemesis. No associated upper or lower abdominal pain. She has ongoing constipation. One day in March, she went one week without passing a bowel movement then eventually passes a painful hard BM with a small to moderate amount of bright red blood. She now reports having a small amount of rectal blood on the toilet tissue, stool and in the toilet water with mild anal discomfort once every few weeks. She cannot recall the last time she saw blood per the rectum. No drug use. Weight has been stable past few months.   CBC Latest Ref Rng & Units 09/04/2020 04/01/2020 09/13/2019  WBC 4.0 - 10.5 K/uL 8.5 9.5 7.8  Hemoglobin 12.0 - 15.0 g/dL 13.7 13.6 13.8  Hematocrit 36.0 - 46.0 % 41.2 40.1 40.9  Platelets 150 - 400 K/uL 246 257.0 250.0   CMP Latest Ref Rng & Units 04/01/2020 09/13/2019  Glucose 70 - 99 mg/dL 84 89  BUN 6 - 23 mg/dL 7 7  Creatinine 0.40 - 1.20 mg/dL 0.72 0.81  Sodium 135 - 145 mEq/L 137 138  Potassium 3.5 - 5.1 mEq/L 4.0 4.1   Chloride 96 - 112 mEq/L 101 104  CO2 19 - 32 mEq/L 28 29  Calcium 8.4 - 10.5 mg/dL 9.6 9.7  Total Protein 6.0 - 8.3 g/dL 7.1 7.5  Total Bilirubin 0.2 - 1.2 mg/dL 0.6 0.7  Alkaline Phos 39 - 117 U/L 86 83  AST 0 - 37 U/L 15 14  ALT 0 - 35 U/L 12 12   Current Outpatient Medications on File Prior to Visit  Medication Sig Dispense Refill  . b complex vitamins tablet Take 1 tablet by mouth daily.    . busPIRone (BUSPAR) 10 MG tablet Take 10 mg by mouth 2 (two) times daily. Takes at 1100 am    . cephALEXin (KEFLEX) 500 MG capsule Take 500 mg by mouth 3 (three) times daily.    . Cholecalciferol (VITAMIN D) 125 MCG (5000 UT) CAPS See admin instructions.    . Cholecalciferol (VITAMIN D3) 125 MCG (5000 UT) CAPS Take 1 capsule by mouth daily.    Marland Kitchen lamoTRIgine (LAMICTAL) 150 MG tablet Take 300 mg by mouth daily. Takes at 1100 am    . loratadine (CLARITIN) 10 MG tablet Take 10 mg by mouth daily. Takes at 1100 am    . lurasidone (LATUDA) 40 MG TABS tablet Take 40 mg by mouth at bedtime.    . Magnesium 250 MG TABS 1 tablet with a meal    . Omega-3 Fatty Acids (OMEGA 3 500) 500 MG CAPS 1 capsule    . Probiotic Product (  PROBIOTIC PO) Take 1 capsule by mouth daily.    . propranolol ER (INDERAL LA) 60 MG 24 hr capsule Take 1 capsule (60 mg total) by mouth daily. (Patient taking differently: Take 60 mg by mouth daily. Takes  at 1100 am) 90 capsule 3  . Ubrogepant (UBRELVY) 100 MG TABS Take 100 mg by mouth as needed (Take 1 at onset of headache, may repeat 2 hours later, max is 200 mg in 24 hours). 8 tablet 11  . dicyclomine (BENTYL) 20 MG tablet 1 tablet (Patient not taking: Reported on 09/17/2020)    . pantoprazole (PROTONIX) 20 MG tablet 1 tablet (Patient not taking: Reported on 09/17/2020)     No current facility-administered medications on file prior to visit.   Allergies  Allergen Reactions  . Topiramate Other (See Comments)    suicidal thoughts  . Lexapro [Escitalopram Oxalate]     Worsened  depression.  . Other     Spice dill is rash  . Macrobid [Nitrofurantoin Macrocrystal] Rash    Current Medications, Allergies, Past Medical History, Past Surgical History, Family History and Social History were reviewed in [employer] record.  Review of Systems:   Constitutional: Negative for fever, sweats, chills. No further weight loss.  Respiratory: Negative for shortness of breath.   Cardiovascular: Negative for chest pain, palpitations and leg swelling.  Gastrointestinal: See HPI.  Musculoskeletal: Negative for back pain or muscle aches.  Neurological: Negative for dizziness, headaches or paresthesias.   Physical Exam: There were no vitals taken for this visit.  BP 100/60   Pulse 68   Ht 5\' 10"  (1.778 m)   Wt 152 lb (68.9 kg)   SpO2 98%   BMI 21.81 kg/m    Wt Readings from Last 3 Encounters:  09/17/20 152 lb (68.9 kg)  09/04/20 155 lb 8 oz (70.5 kg)  07/22/20 152 lb (68.9 kg)    General: 30 year old female in no acute distress. Head: Normocephalic and atraumatic. Eyes: No scleral icterus. Conjunctiva pink . Ears: Normal auditory acuity. Mouth: Dentition intact. No ulcers or lesions.  Lungs: Clear throughout to auscultation. Heart: Regular rate and rhythm, no murmur. Abdomen: Soft, nontender and nondistended. No masses or hepatomegaly. Normal bowel sounds x 4 quadrants. Laparoscopic incisions intact.  Rectal: Posterior anal area with mild erythema, no fissure. No external hemorrhoids. Small internal hemorrhoids without prolapse or bleeding. [name] present during exam.  Musculoskeletal: Symmetrical with no gross deformities. Extremities: No edema. Neurological: Alert oriented x 4. No focal deficits.  Psychological: Alert and cooperative. Normal mood and affect  Assessment and Recommendations:  69. 30 year old female with episodic N/V. Negative abdominal sonogram 9/30/201. EGD 12/20202 showed inactive gastritis. Psychotropic meds may be  contributing to GI symptoms.  -Monitor N/V to see if symptoms improve after IUD was removed on 09/04/2020 -Zofran 4mg  po Q 6 to 8 hrs PRN -Gastric empty study -Consider repeat EGD with Dr. Rush Landmark if symptoms worsen  -Advised 3 to 4 small snack sized meals daily   2. Constipation  -Benefiber 1 tbsp daily if tolerted -Miralax Q HS as needed   3. Rectal bleeding. -Apply a small amount of Desitin inside the anal opening and to the external anal area tid as needed for anal or hemorrhoidal irritation/bleeding.  -I discussed scheduling a diagnostic colonoscopy, as she is s/p laparoscopic sterilization surgery I would allow more time for recover prior to pursing endoscopic evaluation. Patient to follow up in office in 6 to 8 weeks to  schedule a colonoscopy and possible repeat EGD as noted above  -Patient will call office if symptoms worsen  4. Anxiety/depression/biopolar on Latuda, Buspirone

## 2020-09-17 ENCOUNTER — Encounter: Payer: Self-pay | Admitting: Nurse Practitioner

## 2020-09-17 ENCOUNTER — Ambulatory Visit: Payer: 59 | Admitting: Nurse Practitioner

## 2020-09-17 VITALS — BP 100/60 | HR 68 | Ht 70.0 in | Wt 152.0 lb

## 2020-09-17 DIAGNOSIS — K625 Hemorrhage of anus and rectum: Secondary | ICD-10-CM

## 2020-09-17 DIAGNOSIS — K5909 Other constipation: Secondary | ICD-10-CM

## 2020-09-17 DIAGNOSIS — R112 Nausea with vomiting, unspecified: Secondary | ICD-10-CM | POA: Diagnosis not present

## 2020-09-17 MED ORDER — ONDANSETRON 4 MG PO TBDP: 4.0000 mg | ORAL_TABLET | Freq: Three times a day (TID) | ORAL | 1 refills | Status: DC | PRN

## 2020-09-17 NOTE — Patient Instructions (Addendum)
If you are age 31 or older, your body mass index should be between 23-30. Your Body mass index is 21.81 kg/m. If this is out of the aforementioned range listed, please consider follow up with your Primary Care Provider.  If you are age 3 or younger, your body mass index should be between 19-25. Your Body mass index is 21.81 kg/m. If this is out of the aformentioned range listed, please consider follow up with your Primary Care Provider.   You have been scheduled for a gastric emptying scan at [facility] on 10-09-2020 at 730am. Please arrive at least 15 minutes prior to your appointment for registration. Please make certain not to have anything to eat or drink after midnight the night before your test. Hold all stomach medications (ex: Zofran, phenergan, Reglan) 48 hours prior to your test. If you need to reschedule your appointment, please contact radiology scheduling at [phone]. _____________________________________________________________________ A gastric-emptying study measures how long it takes for food to move through your stomach. There are several ways to measure stomach emptying. In the most common test, you eat food that contains a small amount of radioactive material. A scanner that detects the movement of the radioactive material is placed over your abdomen to monitor the rate at which food leaves your stomach. This test normally takes about 4 hours to complete. _____________________________________________________________________  Start Benefiber 1 TBSP daily in water or juice.  Mirlax as needed in the evenings.  Desitin as directed for rectal bleeding or irritation.  Follow up in 6-8 weeks. Please call the office to schedule at [phone]   Thank you for trusting me with your gastrointestinal care!

## 2020-09-18 NOTE — Progress Notes (Signed)
Attending Physician's Attestation   I have reviewed the chart.   I agree with the Advanced Practitioner's note, impression, and recommendations with any updates as below.    Bexton Haak Mansouraty, MD [facility] Advanced [facility]oscopy Office # [phone]  

## 2020-10-09 ENCOUNTER — Ambulatory Visit (HOSPITAL_COMMUNITY)
Admission: RE | Admit: 2020-10-09 | Discharge: 2020-10-09 | Disposition: A | Discharge status: Home / Self Care | Payer: 59 | Source: Ambulatory Visit | Attending: Nurse Practitioner | Admitting: Nurse Practitioner

## 2020-10-09 ENCOUNTER — Other Ambulatory Visit: Payer: Self-pay

## 2020-10-09 DIAGNOSIS — R112 Nausea with vomiting, unspecified: Secondary | ICD-10-CM | POA: Diagnosis present

## 2020-10-09 MED ORDER — TECHNETIUM TC 99M SULFUR COLLOID
2.1000 | Freq: Once | INTRAVENOUS | Status: AC | PRN
  Administered 2020-10-09: 2.1 via INTRAVENOUS

## 2020-11-17 ENCOUNTER — Encounter: Payer: Self-pay | Admitting: Neurology

## 2020-12-24 ENCOUNTER — Other Ambulatory Visit: Payer: Self-pay

## 2020-12-24 ENCOUNTER — Ambulatory Visit (AMBULATORY_SURGERY_CENTER): Payer: Self-pay | Admitting: *Deleted

## 2020-12-24 VITALS — Ht 70.0 in | Wt 157.0 lb

## 2020-12-24 DIAGNOSIS — R112 Nausea with vomiting, unspecified: Secondary | ICD-10-CM

## 2020-12-24 DIAGNOSIS — K625 Hemorrhage of anus and rectum: Secondary | ICD-10-CM

## 2020-12-24 NOTE — Progress Notes (Signed)
Patient is here in-person for PV. Patient denies any allergies to eggs or soy. Patient denies any problems with anesthesia/sedation. Patient denies any oxygen use at home. Patient denies taking any diet/weight loss medications or blood thinners. Patient is aware of our care-partner policy and 0000000 safety protocol.   EMMI education assigned to the patient for the procedure, sent to [place].   Patient had covid vaccines. Patient denies any medical chart hx changes since last GI OV.

## 2020-12-31 ENCOUNTER — Other Ambulatory Visit: Payer: Self-pay

## 2020-12-31 ENCOUNTER — Ambulatory Visit (AMBULATORY_SURGERY_CENTER): Payer: 59 | Admitting: Gastroenterology

## 2020-12-31 ENCOUNTER — Encounter: Payer: Self-pay | Admitting: Gastroenterology

## 2020-12-31 VITALS — BP 106/59 | HR 70 | Temp 98.0°F | Resp 21 | Ht 70.0 in | Wt 157.0 lb

## 2020-12-31 DIAGNOSIS — K625 Hemorrhage of anus and rectum: Secondary | ICD-10-CM | POA: Diagnosis not present

## 2020-12-31 DIAGNOSIS — K319 Disease of stomach and duodenum, unspecified: Secondary | ICD-10-CM | POA: Diagnosis not present

## 2020-12-31 DIAGNOSIS — R112 Nausea with vomiting, unspecified: Secondary | ICD-10-CM

## 2020-12-31 DIAGNOSIS — K3189 Other diseases of stomach and duodenum: Secondary | ICD-10-CM | POA: Diagnosis not present

## 2020-12-31 DIAGNOSIS — K640 First degree hemorrhoids: Secondary | ICD-10-CM

## 2020-12-31 MED ORDER — SODIUM CHLORIDE 0.9 % IV SOLN: 500.0000 mL | Freq: Once | INTRAVENOUS | Status: DC

## 2020-12-31 NOTE — Progress Notes (Signed)
Medical history reviewed with no changes noted. VS assessed by C.W 

## 2020-12-31 NOTE — Op Note (Signed)
[place] Patient Name: Katelyn King Procedure Date: 12/31/2020 11:52 AM MRN: [id number] Endoscopist: Justice Britain , MD Age: 30 Referring MD:  Date of Birth: 01-Mar-1991 Gender: Female Account #: [id number] Procedure:                Upper GI endoscopy Indications:              Nausea with vomiting Medicines:                Monitored Anesthesia Care Procedure:                Pre-Anesthesia Assessment:                           - Prior to the procedure, a History and Physical                            was performed, and patient medications and                            allergies were reviewed. The patient's tolerance of                            previous anesthesia was also reviewed. The risks                            and benefits of the procedure and the sedation                            options and risks were discussed with the patient.                            All questions were answered, and informed consent                            was obtained. Prior Anticoagulants: The patient has                            taken no previous anticoagulant or antiplatelet                            agents. ASA Grade Assessment: II - A patient with                            mild systemic disease. After reviewing the risks                            and benefits, the patient was deemed in                            satisfactory condition to undergo the procedure.                           After obtaining informed consent, the endoscope was  passed under direct vision. Throughout the                            procedure, the patient's blood pressure, pulse, and                            oxygen saturations were monitored continuously. The                            Olympus GIF-HQ190 Endoscope was introduced through                            the mouth, and advanced to the second part of                            duodenum. The upper GI endoscopy  was accomplished                            without difficulty. The patient tolerated the                            procedure. Scope In: Scope Out: Findings:                 No gross lesions were noted in the entire esophagus.                           The Z-line was irregular and was found 40 cm from                            the incisors.                           A 1 cm hiatal hernia was present.                           No gross lesions were noted in the entire examined                            stomach. Biopsies were taken with a cold forceps                            for histology and Helicobacter pylori testing.                           No gross lesions were noted in the duodenal bulb,                            in the first portion of the duodenum and in the                            second portion of the duodenum. Biopsies were taken  with a cold forceps for histology to rule out                            enteropathy. Complications:            No immediate complications. Estimated Blood Loss:     Estimated blood loss was minimal. Impression:               - No gross lesions in esophagus. Z-line irregular,                            40 cm from the incisors.                           - 1 cm hiatal hernia. No gross mucosal lesions in                            the stomach. Biopsied.                           - No gross lesions in the duodenal bulb, in the                            first portion of the duodenum and in the second                            portion of the duodenum. Biopsied. Recommendation:           - Proceed to scheduled colonoscopy.                           - Continue present medications.                           - Await pathology results.                           - The findings and recommendations were discussed                            with the patient.                           - The findings and recommendations were  discussed                            with the designated responsible adult. Justice Britain, MD 12/31/2020 12:18:42 PM

## 2020-12-31 NOTE — Progress Notes (Signed)
GASTROENTEROLOGY PROCEDURE H&P NOTE   Primary Care Physician: Antony Contras, MD  HPI: Katelyn King is a 30 y.o. female who presents for EGD/Colonoscopy for evaluation of N/V/Alteration of bowel habits and rectal bleeding.  Past Medical History:  Diagnosis Date   Anxiety    Bipolar 2 disorder ([place])    Concussion 2019   no residual   Depression    Family history of adverse reaction to anesthesia    father hard to put under, mother has anxiety attacke with anesthesia   Family history of breast cancer    Genetic testing 03/18/2017, again feb 2022   Multi-Cancer panel (83 genes) @ [facility] - No pathogenic mutations detected   Migraine    Mood disorder ([facility])    OSA (obstructive sleep apnea)    uses dental device mild per sleep study 2019   Scoliosis    Past Surgical History:  Procedure Laterality Date   LAPAROSCOPIC BILATERAL SALPINGECTOMY Bilateral 09/04/2020   Procedure: LAPAROSCOPIC BILATERAL SALPINGECTOMY, REMOVAL OF IUD;  Surgeon: Bobbye Charleston, MD;  Location: [facility];  Service: Gynecology;  Laterality: Bilateral;   LAPAROSCOPIC OOPHERECTOMY Left 2012   open   UPPER GI ENDOSCOPY  06/2018   WISDOM TOOTH EXTRACTION  2018   Current Outpatient Medications  Medication Sig Dispense Refill   b complex vitamins tablet Take 1 tablet by mouth daily.     Cholecalciferol (VITAMIN D3) 125 MCG (5000 UT) CAPS Take 1 capsule by mouth daily.     lamoTRIgine (LAMICTAL) 150 MG tablet Take 300 mg by mouth daily. Takes at 1100 am     loratadine (CLARITIN) 10 MG tablet Take 10 mg by mouth daily. Takes at 1100 am     lurasidone (LATUDA) 40 MG TABS tablet Take 40 mg by mouth at bedtime.     Omega-3 Fatty Acids (OMEGA 3 500) 500 MG CAPS 1 capsule     Probiotic Product (PROBIOTIC PO) Take 1 capsule by mouth daily.     propranolol ER (INDERAL LA) 60 MG 24 hr capsule Take 1 capsule (60 mg total) by mouth daily. (Patient taking differently: Take 60 mg by mouth daily. Takes  at  1100 am) 90 capsule 3   busPIRone (BUSPAR) 10 MG tablet Take 10 mg by mouth 2 (two) times daily. Takes at 1100 am     ondansetron (ZOFRAN ODT) 4 MG disintegrating tablet Take 1 tablet (4 mg total) by mouth every 8 (eight) hours as needed for nausea or vomiting. (Patient not taking: Reported on 12/31/2020) 30 tablet 1   Ubrogepant (UBRELVY) 100 MG TABS Take 100 mg by mouth as needed (Take 1 at onset of headache, may repeat 2 hours later, max is 200 mg in 24 hours). (Patient not taking: Reported on 12/31/2020) 8 tablet 11   Current Facility-Administered Medications  Medication Dose Route Frequency Provider Last Rate Last Admin   0.9 %  sodium chloride infusion  500 mL Intravenous Once Mansouraty, Telford Nab., MD        Current Outpatient Medications:    b complex vitamins tablet, Take 1 tablet by mouth daily., Disp: , Rfl:    Cholecalciferol (VITAMIN D3) 125 MCG (5000 UT) CAPS, Take 1 capsule by mouth daily., Disp: , Rfl:    lamoTRIgine (LAMICTAL) 150 MG tablet, Take 300 mg by mouth daily. Takes at 1100 am, Disp: , Rfl:    loratadine (CLARITIN) 10 MG tablet, Take 10 mg by mouth daily. Takes at 1100 am, Disp: , Rfl:  lurasidone (LATUDA) 40 MG TABS tablet, Take 40 mg by mouth at bedtime., Disp: , Rfl:    Omega-3 Fatty Acids (OMEGA 3 500) 500 MG CAPS, 1 capsule, Disp: , Rfl:    Probiotic Product (PROBIOTIC PO), Take 1 capsule by mouth daily., Disp: , Rfl:    propranolol ER (INDERAL LA) 60 MG 24 hr capsule, Take 1 capsule (60 mg total) by mouth daily. (Patient taking differently: Take 60 mg by mouth daily. Takes  at 1100 am), Disp: 90 capsule, Rfl: 3   busPIRone (BUSPAR) 10 MG tablet, Take 10 mg by mouth 2 (two) times daily. Takes at 1100 am, Disp: , Rfl:    ondansetron (ZOFRAN ODT) 4 MG disintegrating tablet, Take 1 tablet (4 mg total) by mouth every 8 (eight) hours as needed for nausea or vomiting. (Patient not taking: Reported on 12/31/2020), Disp: 30 tablet, Rfl: 1   Ubrogepant (UBRELVY) 100 MG TABS,  Take 100 mg by mouth as needed (Take 1 at onset of headache, may repeat 2 hours later, max is 200 mg in 24 hours). (Patient not taking: Reported on 12/31/2020), Disp: 8 tablet, Rfl: 11  Current Facility-Administered Medications:    0.9 %  sodium chloride infusion, 500 mL, Intravenous, Once, Mansouraty, Telford Nab., MD Allergies  Allergen Reactions   Topiramate Other (See Comments)    suicidal thoughts   Escitalopram Other (See Comments)    "suicidal thoughts"    Lexapro [Escitalopram Oxalate]     Worsened depression.   Other     Spice dill is rash   Macrobid [Nitrofurantoin Macrocrystal] Rash   Family History  Problem Relation Age of Onset   Colon polyps Mother    Breast cancer Mother 44       BSO 43; Neg genetic testing (64 genes BreastNext 2016)   Hypothyroidism Sister        in 1 sister   Polycystic ovary syndrome Sister    Ovarian cancer Maternal Aunt 6       currently 56   Hypercholesterolemia Maternal Grandmother    Thyroid cancer Maternal Grandmother 3       currently 11   Heart disease Maternal Grandfather    Breast cancer Paternal Grandmother 74       deceased 9s   Hypercholesterolemia Paternal Grandmother    Hypertension Paternal Grandfather    Colon cancer Neg Hx    Esophageal cancer Neg Hx    Stomach cancer Neg Hx    Social History   Socioeconomic History   Marital status: Divorced    Spouse name: Not on file   Number of children: 0   Years of education: Not on file   Highest education level: Not on file  Occupational History   Occupation: IT    Comment: Investment banker, corporate  Tobacco Use   Smoking status: Never   Smokeless tobacco: Never  Vaping Use   Vaping Use: Never used  Substance and Sexual Activity   Alcohol use: No    Alcohol/week: 0.0 standard drinks   Drug use: No   Sexual activity: Yes    Partners: Male    Birth control/protection: Surgical  Other Topics Concern   Not on file  Social History Narrative   Lives at home    Right-handed.   No caffeine use.   Social Determinants of Health   Financial Resource Strain: Not on file  Food Insecurity: Not on file  Transportation Needs: Not on file  Physical Activity: Not on file  Stress: Not on file  Social Connections: Not on file  Intimate Partner Violence: Not on file    Physical Exam: Vital signs in last 24 hours: '@VSRANGES'$ @   GEN: NAD EYE: Sclerae anicteric ENT: MMM CV: Non-tachycardic GI: Soft, NT/ND NEURO:  Alert & Oriented x 3  Lab Results: No results for input(s): WBC, HGB, HCT, PLT in the last 72 hours. BMET No results for input(s): NA, K, CL, CO2, GLUCOSE, BUN, CREATININE, CALCIUM in the last 72 hours. LFT No results for input(s): PROT, ALBUMIN, AST, ALT, ALKPHOS, BILITOT, BILIDIR, IBILI in the last 72 hours. PT/INR No results for input(s): LABPROT, INR in the last 72 hours.   Impression / Plan: This is a 30 y.o.female who presents for EGD/Colonoscopy for evaluation of N/V/Alteration of bowel habits and rectal bleeding.  The risks and benefits of endoscopic evaluation were discussed with the patient; these include but are not limited to the risk of perforation, infection, bleeding, missed lesions, lack of diagnosis, severe illness requiring hospitalization, as well as anesthesia and sedation related illnesses.  The patient is agreeable to proceed.    Justice Britain, MD [facility] Office # [phone]

## 2020-12-31 NOTE — Patient Instructions (Addendum)
Toileting tips to help with your constipation - Drink at least 64-80 ounces of water/liquid per day. - Establish a time to try to move your bowels every day.  For many people, this is after a cup of coffee or after a meal such as breakfast. - Sit all of the way back on the toilet keeping your back fairly straight and while sitting up, try to rest the tops of your forearms on your upper thighs.   - Raising your feet with a step stool/squatty potty can be helpful to improve the angle that allows your stool to pass through the rectum. - Relax the rectum feeling it bulge toward the toilet water.  If you feel your rectum raising toward your body, you are contracting rather than relaxing. - Breathe in and slowly exhale. "Belly breath" by expanding your belly towards your belly button. Keep belly expanded as you gently direct pressure down and back to the anus.  A low pitched GRRR sound can assist with increasing intra-abdominal pressure.  - Repeat 3-4 times. If unsuccessful, contract the pelvic floor to restore normal tone and get off the toilet.  Avoid excessive straining. - To reduce excessive wiping by teaching your anus to normally contract, place hands on outer aspect of knees and resist knee movement outward.  Hold 5-10 second then place hands just inside of knees and resist inward movement of knees.  Hold 5 seconds.  Repeat a few times each way.        YOU HAD AN ENDOSCOPIC PROCEDURE TODAY AT [facility]:   Refer to the procedure report that was given to you for any specific questions about what was found during the examination.  If the procedure report does not answer your questions, please call your gastroenterologist to clarify.  If you requested that your care partner not be given the details of your procedure findings, then the procedure report has been included in a sealed envelope for you to review at your convenience later.  YOU SHOULD EXPECT: Some feelings of bloating in  the abdomen. Passage of more gas than usual.  Walking can help get rid of the air that was put into your GI tract during the procedure and reduce the bloating. If you had a lower endoscopy (such as a colonoscopy or flexible sigmoidoscopy) you may notice spotting of blood in your stool or on the toilet paper. If you underwent a bowel prep for your procedure, you may not have a normal bowel movement for a few days.  Please Note:  You might notice some irritation and congestion in your nose or some drainage.  This is from the oxygen used during your procedure.  There is no need for concern and it should clear up in a day or so.  SYMPTOMS TO REPORT IMMEDIATELY:  Following lower endoscopy (colonoscopy or flexible sigmoidoscopy):  Excessive amounts of blood in the stool  Significant tenderness or worsening of abdominal pains  Swelling of the abdomen that is new, acute  Fever of 100F or higher  Following upper endoscopy (EGD)  Vomiting of blood or coffee ground material  New chest pain or pain under the shoulder blades  Painful or persistently difficult swallowing  New shortness of breath  Fever of 100F or higher  Black, tarry-looking stools  For urgent or emergent issues, a gastroenterologist can be reached at any hour by calling [phone]. Do not use MyChart messaging for urgent concerns.    DIET:  We do recommend a small  meal at first, but then you may proceed to your regular diet.  Drink plenty of fluids but you should avoid alcoholic beverages for 24 hours.  ACTIVITY:  You should plan to take it easy for the rest of today and you should NOT DRIVE or use heavy machinery until tomorrow (because of the sedation medicines used during the test).    FOLLOW UP: Our staff will call the number listed on your records 48-72 hours following your procedure to check on you and address any questions or concerns that you may have regarding the information given to you following your procedure. If  we do not reach you, we will leave a message.  We will attempt to reach you two times.  During this call, we will ask if you have developed any symptoms of COVID 19. If you develop any symptoms (ie: fever, flu-like symptoms, shortness of breath, cough etc.) before then, please call [phone].  If you test positive for Covid 19 in the 2 weeks post procedure, please call and report this information to [place].    If any biopsies were taken you will be contacted by phone or by letter within the next 1-3 weeks.  Please call us at [phone] if you have not heard about the biopsies in 3 weeks.    SIGNATURES/CONFIDENTIALITY: You and/or your care partner have signed paperwork which will be entered into your electronic medical record.  These signatures attest to the fact that that the information above on your After Visit Summary has been reviewed and is understood.  Full responsibility of the confidentiality of this discharge information lies with you and/or your care-partner.    Resume medications. Information given on Hiatal Hernia,hemorrhoids and high fiber diet. SEE PROCEDURE REPORT FOR OVER THE COUNTER FIBER RECOMMENDATIONS.

## 2020-12-31 NOTE — Progress Notes (Signed)
A and O x3. Report to RN. Tolerated MAC anesthesia well.Teeth unchanged after procedure. 

## 2020-12-31 NOTE — Progress Notes (Signed)
Called to room to assist during endoscopic procedure.  Patient ID and intended procedure confirmed with present staff. Received instructions for my participation in the procedure from the performing physician.  

## 2020-12-31 NOTE — Op Note (Signed)
[facility] Patient Name: Katelyn King Procedure Date: 12/31/2020 11:52 AM MRN: [id number] Endoscopist: Justice Britain , MD Age: 30 Referring MD:  Date of Birth: 1990/11/18 Gender: Female Account #: [id number] Procedure:                Colonoscopy Indications:              Hematochezia Medicines:                Monitored Anesthesia Care Procedure:                Pre-Anesthesia Assessment:                           - Prior to the procedure, a History and Physical                            was performed, and patient medications and                            allergies were reviewed. The patient's tolerance of                            previous anesthesia was also reviewed. The risks                            and benefits of the procedure and the sedation                            options and risks were discussed with the patient.                            All questions were answered, and informed consent                            was obtained. Prior Anticoagulants: The patient has                            taken no previous anticoagulant or antiplatelet                            agents. ASA Grade Assessment: II - A patient with                            mild systemic disease. After reviewing the risks                            and benefits, the patient was deemed in                            satisfactory condition to undergo the procedure.                           After obtaining informed consent, the colonoscope  was passed under direct vision. Throughout the                            procedure, the patient's blood pressure, pulse, and                            oxygen saturations were monitored continuously. The                            Olympus PCF-H190DL (#[id number]) Colonoscope was                            introduced through the anus and advanced to the 10                            cm into the ileum. The colonoscopy was performed                             without difficulty. The patient tolerated the                            procedure. The quality of the bowel preparation was                            good. The terminal ileum, ileocecal valve,                            appendiceal orifice, and rectum were photographed. Scope In: 12:05:27 PM Scope Out: 12:14:54 PM Scope Withdrawal Time: 0 hours 7 minutes 4 seconds  Total Procedure Duration: 0 hours 9 minutes 27 seconds  Findings:                 The digital rectal exam was normal. Pertinent                            negatives include no palpable rectal lesions.                           The terminal ileum and ileocecal valve appeared                            normal.                           Normal mucosa was found in the entire colon.                           Non-bleeding non-thrombosed internal hemorrhoids                            were found during retroflexion, during perianal                            exam and during digital exam. The hemorrhoids were  Grade I (internal hemorrhoids that do not prolapse). Complications:            No immediate complications. Estimated Blood Loss:     Estimated blood loss was minimal. Estimated blood                            loss: none. Impression:               - The examined portion of the ileum was normal.                           - Normal mucosa in the entire examined colon.                           - Non-bleeding non-thrombosed internal hemorrhoids. Recommendation:           - The patient will be observed post-procedure,                            until all discharge criteria are met.                           - Discharge patient to home.                           - Patient has a contact number available for                            emergencies. The signs and symptoms of potential                            delayed complications were discussed with the                             patient. Return to normal activities tomorrow.                            Written discharge instructions were provided to the                            patient.                           - High fiber diet.                           - Use FiberCon 1-2 tablets PO daily.                           - May use Anusol suppositories if rectal bleeding                            is occuring multiple times per week..                           - Will review imaging with my colleagues to  consider role of hemorrhoidal banding in future,                            should bleeding persist after optimization of bowel                            habits and straining.                           - Should be using Miralax daily if not having at                            least 4 bowel movements per week. If constipation                            remains significant, can consider                            Linzess/Trulance/Motegrity/Amitiza in future.                           - Suggestions placed in the patient instructions                            for optimization of passing bowel movements.                           - Repeat Colonoscopy for screening at age 75 unless                            other issues arise between now and then.                           - The findings and recommendations were discussed                            with the patient.                           - The findings and recommendations were discussed                            with the designated responsible adult. Justice Britain, MD 12/31/2020 12:24:23 PM

## 2021-01-02 ENCOUNTER — Telehealth: Payer: Self-pay

## 2021-01-02 NOTE — Telephone Encounter (Signed)
Attempted to reach patient for post-procedure f/u call. No answer. Left message that staff will make another attempt to reach her later today and for her to please not hesitate to call us if she has any questions/concerns regarding her care.

## 2021-01-02 NOTE — Telephone Encounter (Signed)
  Follow up Call-  Call back number 12/31/2020  Post procedure Call Back phone  # [phone]  Permission to leave phone message Yes  Some recent data might be hidden     Patient questions:  Do you have a fever, pain , or abdominal swelling? No. Pain Score  0 *  Have you tolerated food without any problems? Yes.    Have you been able to return to your normal activities? Yes.    Do you have any questions about your discharge instructions: Diet   No. Medications  No. Follow up visit  No.  Do you have questions or concerns about your Care? No.  Actions: * If pain score is 4 or above: No action needed, pain <4.

## 2021-01-05 ENCOUNTER — Encounter: Payer: Self-pay | Admitting: Gastroenterology

## 2021-02-25 ENCOUNTER — Telehealth (INDEPENDENT_AMBULATORY_CARE_PROVIDER_SITE_OTHER): Payer: 59 | Admitting: Neurology

## 2021-02-25 ENCOUNTER — Encounter: Payer: Self-pay | Admitting: Neurology

## 2021-02-25 DIAGNOSIS — G43009 Migraine without aura, not intractable, without status migrainosus: Secondary | ICD-10-CM

## 2021-02-25 MED ORDER — ONDANSETRON 4 MG PO TBDP: 4.0000 mg | ORAL_TABLET | Freq: Three times a day (TID) | ORAL | 3 refills | Status: AC | PRN

## 2021-02-25 MED ORDER — PROPRANOLOL HCL ER 60 MG PO CP24: 60.0000 mg | ORAL_CAPSULE | Freq: Every day | ORAL | 3 refills | Status: DC

## 2021-02-25 NOTE — Progress Notes (Addendum)
ASSESSMENT AND PLAN 30 y.o. year old female    Chronic migraine headache On average 1 migraine weekly Side effect with Aimovig, insurance would not cover Emgality, previously tried and failed Topamax For abortive treatment, she has tried Nurtec, triptans without significant benefit Continue Inderal LA 60 mg daily as migraine prevention Depression, on polypharmacy treatment, lamotrigine, Latuda, She is performed over-the-counter NSAIDs, Excedrin Migraine as needed now, Zofran as needed Return to clinic for new issues   HISTORY OF PRESENT ILLNESS: Katelyn King is a 30 years old right-handed female, accompanied by her husband, seen in refer by her primary care physician Dr. Antony Contras for evaluation of chronic migraine   She had a history of bipolar type II, is taking lamotrigine 200 mg every night, Latuda 20 mg every night,   She reported a history of migraine since elementary school, her typical migraine are lateralized severe pounding headache was associated light noise sensitivity, lasting 6-8 hours, she brought headache diary at today's visit, she has average 6 to 8 typical migraines each month, moderate to severe, she has been taking over-the-counter Tylenol, sometimes with caffeinated drink to help her headache, occasionally her migraines preceded by visual distortion.   Trigger for her headache a sleep deprivation, weather change, stress,   Laboratory in 2016, normal glucose 79, LDL 72, normal CBC, hemoglobin 13.8, normal ferritin 70 7.9, TSH 3.79   I have personally reviewed CAT scan of the brain without contrast January 3rd 2016, that was within normal limit     Virtual Visit via video Location: Provider [place] office, Patient: Home I connected with Marjory Sneddon  on  Oct 4th by a video enabled telemedicine application and verified that I am speaking with the correct person using two  identifiers.   UPDATE: She is overall doing very well, only has headache once a week,,  taking over-the-counter NSAIDs, Excedrin Migraine, Tylenol for headache control, happy with his current result, previously tried [place], Maxalt, Relpax with limited help, does not want to try any prescription medicine at this point,    Observations/Objective: I have reviewed problem lists, medications, allergies.   Awake, alert, oriented to history taking and casual conversation, facial symmetric, no dysarthria, no aphasia, moving 4 extremities without difficulties.  REVIEW OF SYSTEMS: Out of a complete 14 system review of symptoms, the patient complains only of the following symptoms, and all other reviewed systems are negative.  Headache  ALLERGIES: Allergies  Allergen Reactions   Topiramate Other (See Comments)    suicidal thoughts   Escitalopram Other (See Comments)    "suicidal thoughts"    Lexapro [Escitalopram Oxalate]     Worsened depression.   Other     Spice dill is rash   Macrobid [Nitrofurantoin Macrocrystal] Rash    HOME MEDICATIONS: Outpatient Medications Prior to Visit  Medication Sig Dispense Refill   b complex vitamins tablet Take 1 tablet by mouth daily.     busPIRone (BUSPAR) 10 MG tablet Take 10 mg by mouth 2 (two) times daily. Takes at 1100 am     Cholecalciferol (VITAMIN D3) 125 MCG (5000 UT) CAPS Take 1 capsule by mouth daily.     lamoTRIgine (LAMICTAL) 150 MG tablet Take 300 mg by mouth daily. Takes at 1100 am     loratadine (CLARITIN) 10 MG tablet Take 10 mg by mouth daily. Takes at 1100 am     lurasidone (LATUDA) 40 MG TABS tablet Take 40 mg by mouth at bedtime.     Omega-3 Fatty Acids (OMEGA  3 500) 500 MG CAPS 1 capsule     ondansetron (ZOFRAN ODT) 4 MG disintegrating tablet Take 1 tablet (4 mg total) by mouth every 8 (eight) hours as needed for nausea or vomiting. (Patient not taking: Reported on 12/31/2020) 30 tablet 1   Probiotic Product (PROBIOTIC PO) Take 1 capsule by mouth daily.     propranolol ER (INDERAL LA) 60 MG 24 hr capsule Take 1 capsule  (60 mg total) by mouth daily. (Patient taking differently: Take 60 mg by mouth daily. Takes  at 1100 am) 90 capsule 3   Ubrogepant (UBRELVY) 100 MG TABS Take 100 mg by mouth as needed (Take 1 at onset of headache, may repeat 2 hours later, max is 200 mg in 24 hours). (Patient not taking: Reported on 12/31/2020) 8 tablet 11   No facility-administered medications prior to visit.    PAST MEDICAL HISTORY: Past Medical History:  Diagnosis Date   Anxiety    Bipolar 2 disorder ([place])    Concussion 2019   no residual   Depression    Family history of adverse reaction to anesthesia    father hard to put under, mother has anxiety attacke with anesthesia   Family history of breast cancer    Genetic testing 03/18/2017, again feb 2022   Multi-Cancer panel (83 genes) @ [facility] - No pathogenic mutations detected   Migraine    Mood disorder ([facility])    OSA (obstructive sleep apnea)    uses dental device mild per sleep study 2019   Scoliosis     PAST SURGICAL HISTORY: Past Surgical History:  Procedure Laterality Date   LAPAROSCOPIC BILATERAL SALPINGECTOMY Bilateral 09/04/2020   Procedure: LAPAROSCOPIC BILATERAL SALPINGECTOMY, REMOVAL OF IUD;  Surgeon: Bobbye Charleston, MD;  Location: [facility];  Service: Gynecology;  Laterality: Bilateral;   LAPAROSCOPIC OOPHERECTOMY Left 2012   open   UPPER GI ENDOSCOPY  06/2018   WISDOM TOOTH EXTRACTION  2018    FAMILY HISTORY: Family History  Problem Relation Age of Onset   Colon polyps Mother    Breast cancer Mother 89       BSO 5; Neg genetic testing (72 genes BreastNext 2016)   Hypothyroidism Sister        in 1 sister   Polycystic ovary syndrome Sister    Ovarian cancer Maternal Aunt 18       currently 67   Hypercholesterolemia Maternal Grandmother    Thyroid cancer Maternal Grandmother 52       currently 15   Heart disease Maternal Grandfather    Breast cancer Paternal Grandmother 29       deceased 41s   Hypercholesterolemia  Paternal Grandmother    Hypertension Paternal Grandfather    Colon cancer Neg Hx    Esophageal cancer Neg Hx    Stomach cancer Neg Hx     SOCIAL HISTORY: Social History   Socioeconomic History   Marital status: Divorced    Spouse name: Not on file   Number of children: 0   Years of education: Not on file   Highest education level: Not on file  Occupational History   Occupation: IT    Comment: Investment banker, corporate  Tobacco Use   Smoking status: Never   Smokeless tobacco: Never  Vaping Use   Vaping Use: Never used  Substance and Sexual Activity   Alcohol use: No    Alcohol/week: 0.0 standard drinks   Drug use: No   Sexual activity: Yes    Partners: Male  Birth control/protection: Surgical  Other Topics Concern   Not on file  Social History Narrative   Lives at home   Right-handed.   No caffeine use.   Social Determinants of Health   Financial Resource Strain: Not on file  Food Insecurity: Not on file  Transportation Needs: Not on file  Physical Activity: Not on file  Stress: Not on file  Social Connections: Not on file  Intimate Partner Violence: Not on file   DIAGNOSTIC DATA (LABS, IMAGING, TESTING) - I reviewed patient records, labs, notes, testing and imaging myself where available.  Lab Results  Component Value Date   WBC 8.5 09/04/2020   HGB 13.7 09/04/2020   HCT 41.2 09/04/2020   MCV 99.8 09/04/2020   PLT 246 09/04/2020      Component Value Date/Time   NA 137 04/01/2020 1357   K 4.0 04/01/2020 1357   CL 101 04/01/2020 1357   CO2 28 04/01/2020 1357   GLUCOSE 84 04/01/2020 1357   BUN 7 04/01/2020 1357   CREATININE 0.72 04/01/2020 1357   CALCIUM 9.6 04/01/2020 1357   PROT 7.1 04/01/2020 1357   ALBUMIN 4.8 04/01/2020 1357   AST 15 04/01/2020 1357   ALT 12 04/01/2020 1357   ALKPHOS 86 04/01/2020 1357   BILITOT 0.6 04/01/2020 1357   No results found for: CHOL, HDL, LDLCALC, LDLDIRECT, TRIG, CHOLHDL No results found for: HGBA1C No results  found for: [id number] Lab Results  Component Value Date   TSH 3.98 05/28/2020    Marcial Pacas, M.D. Ph.D.  [facility] [place], [place] [address] Phone: [phone] Fax:      [phone]

## 2021-12-25 IMAGING — US US ABDOMEN COMPLETE
1 series · 14 of 25 positions shown · non-contrast
Comparison: None.

CLINICAL DATA: Abdomen pain with nausea vomiting

EXAM:
ABDOMEN ULTRASOUND COMPLETE

[Series 1: us abdomen complete · 14 of 70 slices shown]
[im 1/70]
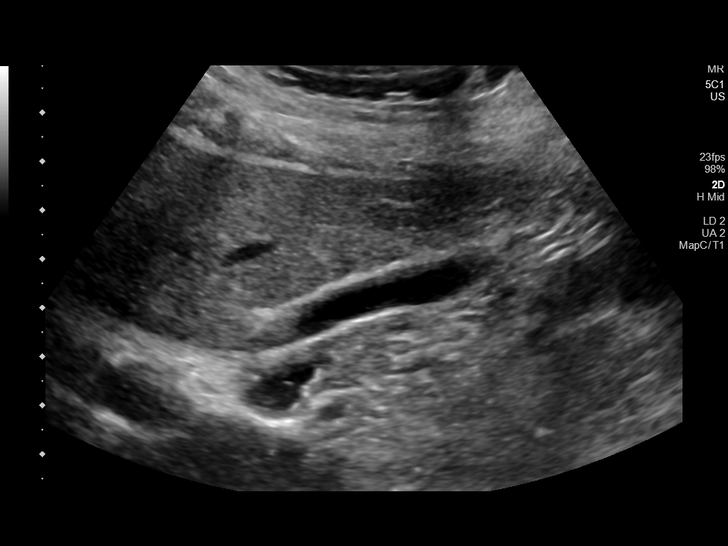
[im 6/70]
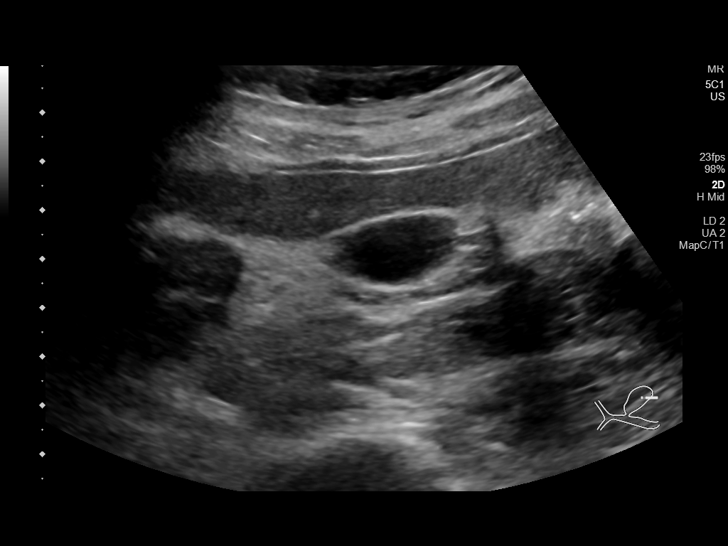
[im 12/70]
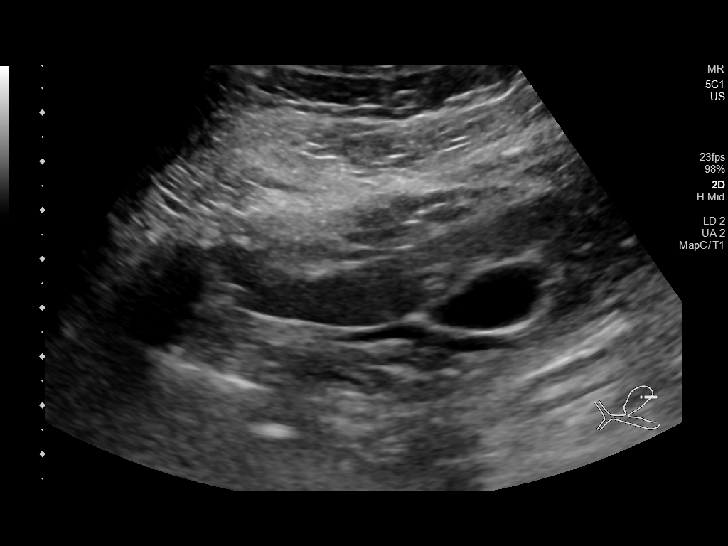
[im 18/70]
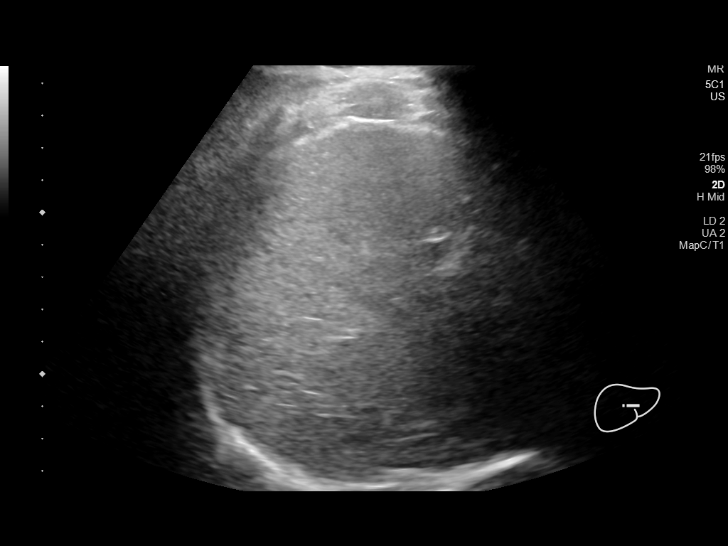
[im 24/70]
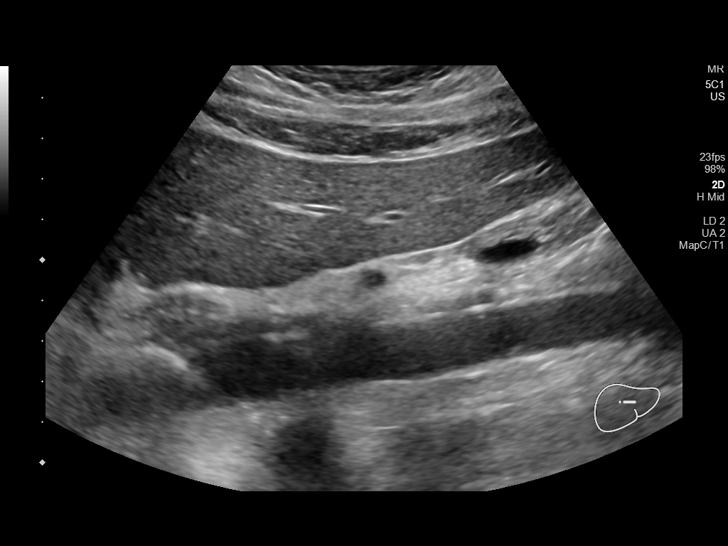
[im 26/70]
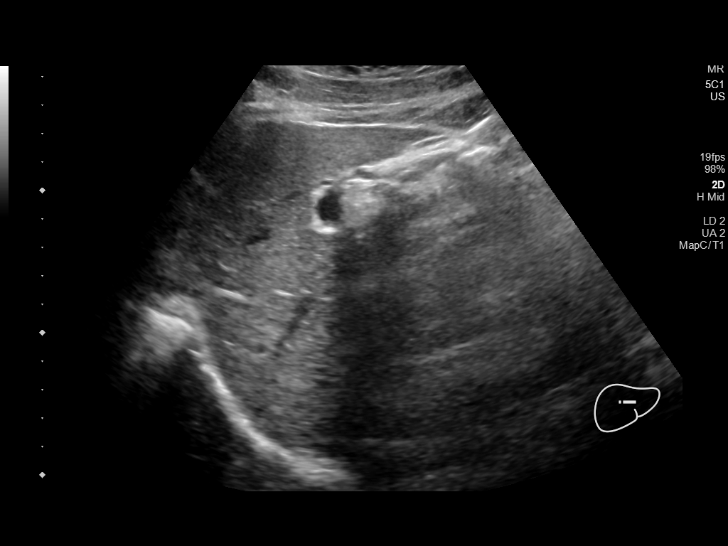
[im 32/70]
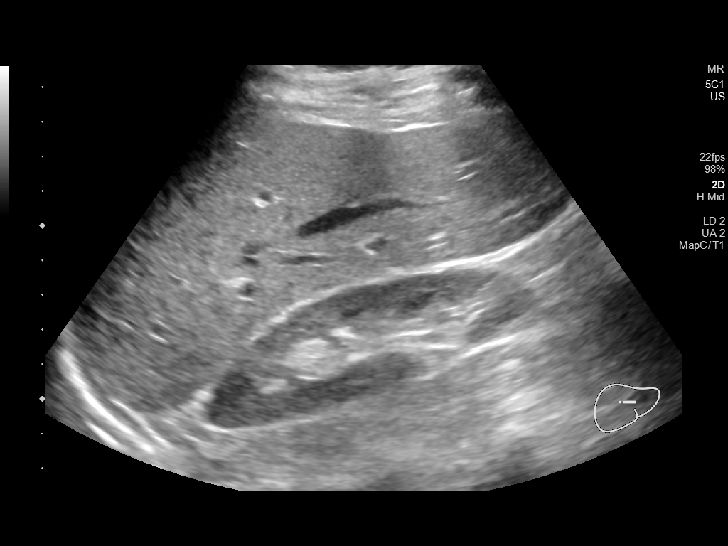
[im 38/70]
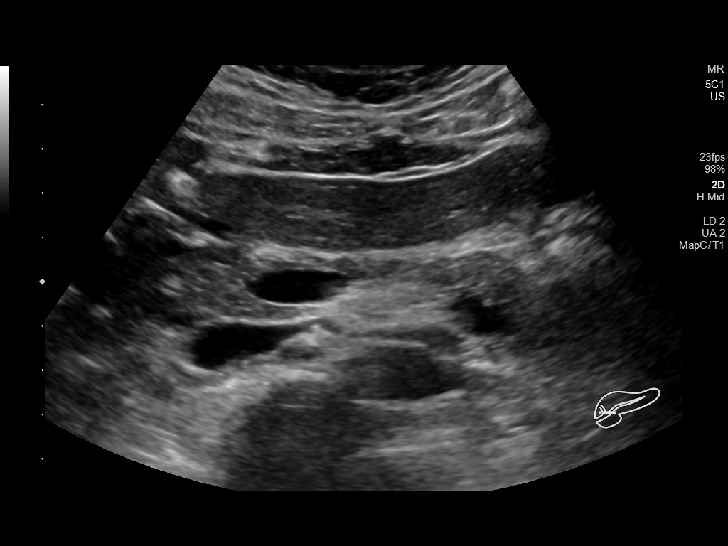
[im 44/70]
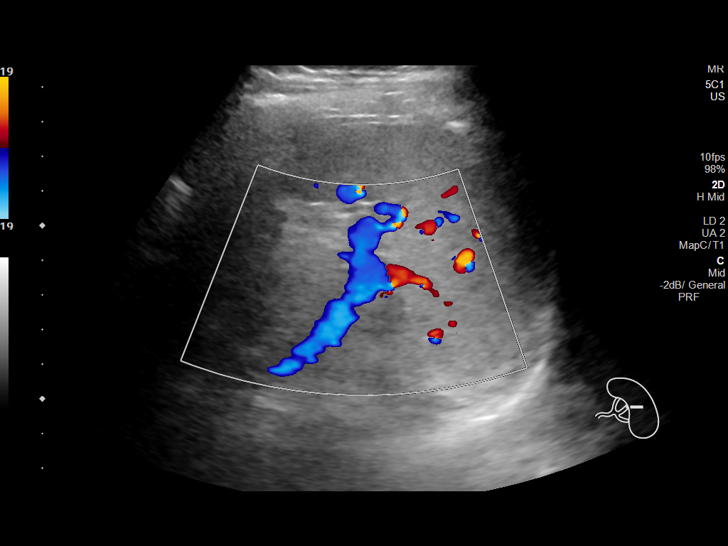
[im 47/70]
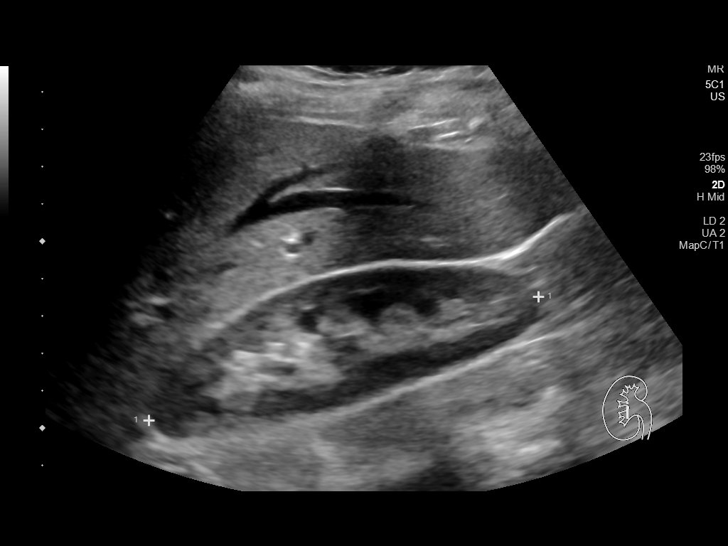
[im 52/70]
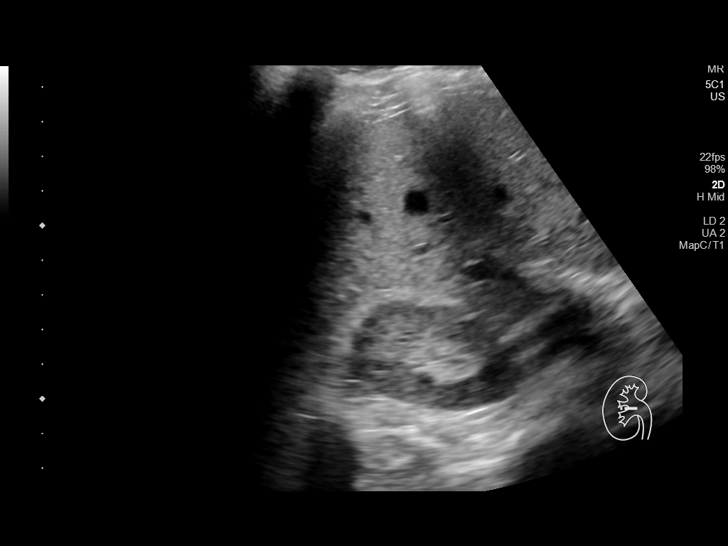
[im 58/70]
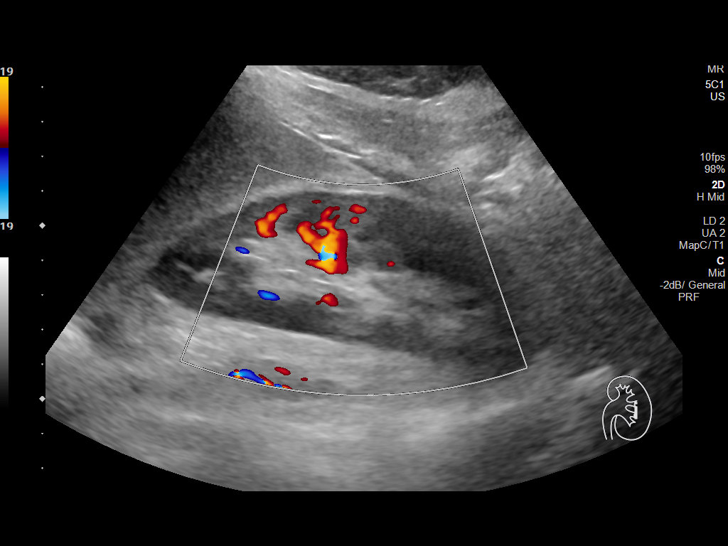
[im 64/70]
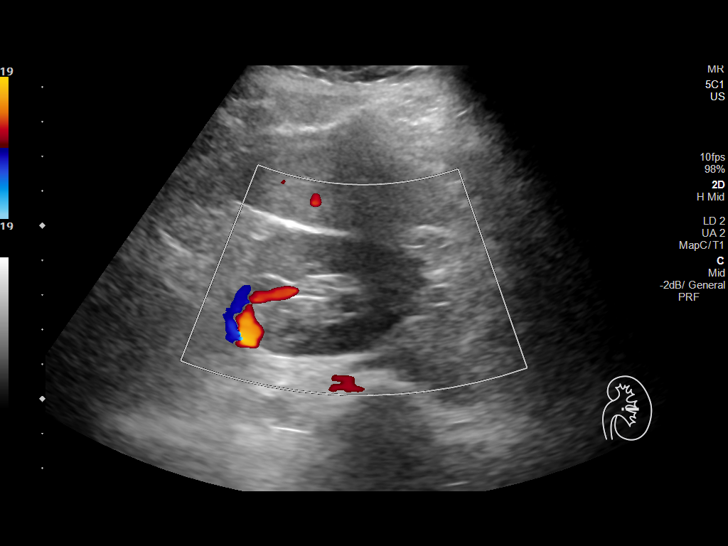
[im 70/70]
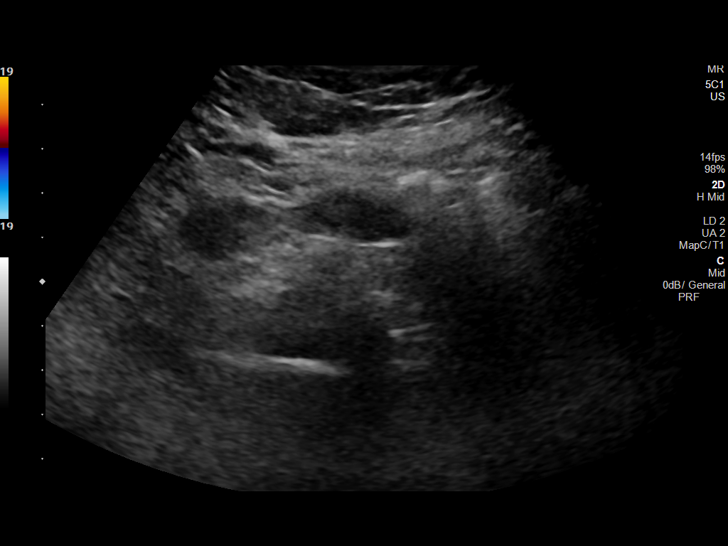

[14 of 25 positions shown; findings below may reference images not displayed]

FINDINGS: Gallbladder: No gallstones or wall thickening visualized. No
sonographic Murphy sign noted by sonographer.

Common bile duct: Diameter: 3.7 mm

Liver: No focal lesion identified. Within normal limits in
parenchymal echogenicity. Portal vein is patent on color Doppler
imaging with normal direction of blood flow towards the liver.

IVC: No abnormality visualized.

Pancreas: Visualized portion unremarkable.

Spleen: Size and appearance within normal limits.

Right Kidney: Length: 10.9 cm. Echogenicity within normal limits. No
mass or hydronephrosis visualized.

Left Kidney: Length: 11.7 cm. Echogenicity within normal limits. No
mass or hydronephrosis visualized.

Abdominal aorta: No aneurysm visualized.

Other findings: None.
IMPRESSION: Negative abdominal ultrasound

## 2022-02-23 NOTE — Progress Notes (Signed)
Patient: Katelyn King Date of Birth: 06/09/1990  Reason for Visit: Follow up History from: Patient Primary Neurologist: Dr. Krista Blue   ASSESSMENT AND PLAN 31 y.o. year old female   1.  Chronic migraine headache -Wean off Inderal LA due to concern for dizziness, potentially related to low normal HR -Start Nurtec 75 mg every other day as migraine preventative, other options may be Qulipta -Previously tried and failed: Topamax, Aimovig, insurance would not cover Emgality; no benefit with triptans, or Ubrelvy  -Can continue Tylenol or ibuprofen for acute headache -Call for dose adjustment, follow-up in 4 to 6 months or sooner if needed  HISTORY  Katelyn King is a 31 years old right-handed female, accompanied by her husband, seen in refer by her primary care physician Dr. Antony Contras for evaluation of chronic migraine   She had a history of bipolar type II, is taking lamotrigine 200 mg every night, Latuda 20 mg every night,   She reported a history of migraine since elementary school, her typical migraine are lateralized severe pounding headache was associated light noise sensitivity, lasting 6-8 hours, she brought headache diary at today's visit, she has average 6 to 8 typical migraines each month, moderate to severe, she has been taking over-the-counter Tylenol, sometimes with caffeinated drink to help her headache, occasionally her migraines preceded by visual distortion.   Trigger for her headache a sleep deprivation, weather change, stress,   Laboratory in 2016, normal glucose 79, LDL 72, normal CBC, hemoglobin 13.8, normal ferritin 70 7.9, TSH 3.79   I have personally reviewed CAT scan of the brain without contrast January 3rd 2016, that was within normal limit    Virtual Visit via video Location: Provider [place] office, Patient: Home I connected with Katelyn King  on  Oct 4th by a video enabled telemedicine application and verified that I am speaking with the correct person using two   identifiers.   UPDATE: She is overall doing very well, only has headache once a week,, taking over-the-counter NSAIDs, Excedrin Migraine, Tylenol for headache control, happy with his current result, previously tried Roselyn Meier, Maxalt, Relpax with limited help, does not want to try any prescription medicine at this point,  Update February 24, 2022 SS: Still on Inderal LA 60 mg daily. Had COVID in Feb 2023, to her significant achy symptoms with headache, cough. Since COVID 3-4 migraines a week. With migraine features, dizziness. For acute headache alternatives between Ibuprofen and Tylenol, usually works. Excedrin makes her not sleep. Ubrelvy didn't help. Brain fog since COVID, is in between jobs.  REVIEW OF SYSTEMS: Out of a complete 14 system review of symptoms, the patient complains only of the following symptoms, and all other reviewed systems are negative.  See HPI  ALLERGIES: Allergies  Allergen Reactions   Topiramate Other (See Comments)    suicidal thoughts   Escitalopram Other (See Comments)    "suicidal thoughts"    Lexapro [Escitalopram Oxalate]     Worsened depression.   Other     Spice dill is rash   Macrobid [Nitrofurantoin Macrocrystal] Rash    HOME MEDICATIONS: Outpatient Medications Prior to Visit  Medication Sig Dispense Refill   Cholecalciferol (VITAMIN D3) 125 MCG (5000 UT) CAPS Take 1 capsule by mouth daily.     lamoTRIgine (LAMICTAL) 150 MG tablet Take 300 mg by mouth daily. Takes at 1100 am     levothyroxine (SYNTHROID) 50 MCG tablet Take 50 mcg by mouth daily before breakfast.     loratadine (CLARITIN) 10  MG tablet Take 10 mg by mouth daily. Takes at 1100 am     ondansetron (ZOFRAN ODT) 4 MG disintegrating tablet Take 1 tablet (4 mg total) by mouth every 8 (eight) hours as needed for nausea or vomiting. 30 tablet 3   Probiotic Product (PROBIOTIC PO) Take 1 capsule by mouth daily.     propranolol ER (INDERAL LA) 60 MG 24 hr capsule Take 1 capsule (60 mg total) by  mouth daily. 90 capsule 3   b complex vitamins tablet Take 1 tablet by mouth daily.     lurasidone (LATUDA) 40 MG TABS tablet Take 40 mg by mouth at bedtime.     Omega-3 Fatty Acids (OMEGA 3 500) 500 MG CAPS 1 capsule     No facility-administered medications prior to visit.    PAST MEDICAL HISTORY: Past Medical History:  Diagnosis Date   Anxiety    Bipolar 2 disorder ([place])    Concussion 2019   no residual   Depression    Family history of adverse reaction to anesthesia    father hard to put under, mother has anxiety attacke with anesthesia   Family history of breast cancer    Genetic testing 03/18/2017, again feb 2022   Multi-Cancer panel (83 genes) @ [facility] - No pathogenic mutations detected   Migraine    Mood disorder ([facility])    OSA (obstructive sleep apnea)    uses dental device mild per sleep study 2019   Scoliosis     PAST SURGICAL HISTORY: Past Surgical History:  Procedure Laterality Date   LAPAROSCOPIC BILATERAL SALPINGECTOMY Bilateral 09/04/2020   Procedure: LAPAROSCOPIC BILATERAL SALPINGECTOMY, REMOVAL OF IUD;  Surgeon: Bobbye Charleston, MD;  Location: [place];  Service: Gynecology;  Laterality: Bilateral;   LAPAROSCOPIC OOPHERECTOMY Left 2012   open   UPPER GI ENDOSCOPY  06/2018   WISDOM TOOTH EXTRACTION  2018    FAMILY HISTORY: Family History  Problem Relation Age of Onset   Colon polyps Mother    Breast cancer Mother 50       BSO 60; Neg genetic testing (72 genes BreastNext 2016)   Hypothyroidism Sister        in 1 sister   Polycystic ovary syndrome Sister    Ovarian cancer Maternal Aunt 12       currently 68   Hypercholesterolemia Maternal Grandmother    Thyroid cancer Maternal Grandmother 45       currently 55   Heart disease Maternal Grandfather    Breast cancer Paternal Grandmother 91       deceased 31s   Hypercholesterolemia Paternal Grandmother    Hypertension Paternal Grandfather    Colon cancer Neg Hx    Esophageal  cancer Neg Hx    Stomach cancer Neg Hx     SOCIAL HISTORY: Social History   Socioeconomic History   Marital status: Divorced    Spouse name: Not on file   Number of children: 0   Years of education: Not on file   Highest education level: Not on file  Occupational History   Occupation: IT    Comment: Investment banker, corporate  Tobacco Use   Smoking status: Never   Smokeless tobacco: Never  Vaping Use   Vaping Use: Never used  Substance and Sexual Activity   Alcohol use: No    Alcohol/week: 0.0 standard drinks of alcohol   Drug use: No   Sexual activity: Yes    Partners: Male    Birth control/protection: Surgical  Other Topics  Concern   Not on file  Social History Narrative   Lives at home   Right-handed.   No caffeine use.   Social Determinants of Health   Financial Resource Strain: Not on file  Food Insecurity: Not on file  Transportation Needs: Not on file  Physical Activity: Not on file  Stress: Not on file  Social Connections: Not on file  Intimate Partner Violence: Not on file    PHYSICAL EXAM  Vitals:   02/24/22 1043  BP: 116/64  Pulse: 66  Weight: 162 lb (73.5 kg)  Height: '5\' 10"'$  (1.778 m)   Body mass index is 23.24 kg/m.  Generalized: Well developed, in no acute distress  Neurological examination  Mentation: Alert oriented to time, place, history taking. Follows all commands speech and language fluent Cranial nerve II-XII: Pupils were equal round reactive to light. Extraocular movements were full, visual field were full on confrontational test. Facial sensation and strength were normal.  Head turning and shoulder shrug  were normal and symmetric. Motor: The motor testing reveals 5 over 5 strength of all 4 extremities. Good symmetric motor tone is noted throughout.  Sensory: Sensory testing is intact to soft touch on all 4 extremities. No evidence of extinction is noted.  Coordination: Cerebellar testing reveals good finger-nose-finger and heel-to-shin  bilaterally.  Slight tremor with finger-nose-finger, on the left than the right. Gait and station: Gait is normal. Tandem gait is normal.  Reflexes: Deep tendon reflexes are symmetric and normal bilaterally.   DIAGNOSTIC DATA (LABS, IMAGING, TESTING) - I reviewed patient records, labs, notes, testing and imaging myself where available.  Lab Results  Component Value Date   WBC 8.5 09/04/2020   HGB 13.7 09/04/2020   HCT 41.2 09/04/2020   MCV 99.8 09/04/2020   PLT 246 09/04/2020      Component Value Date/Time   NA 137 04/01/2020 1357   K 4.0 04/01/2020 1357   CL 101 04/01/2020 1357   CO2 28 04/01/2020 1357   GLUCOSE 84 04/01/2020 1357   BUN 7 04/01/2020 1357   CREATININE 0.72 04/01/2020 1357   CALCIUM 9.6 04/01/2020 1357   PROT 7.1 04/01/2020 1357   ALBUMIN 4.8 04/01/2020 1357   AST 15 04/01/2020 1357   ALT 12 04/01/2020 1357   ALKPHOS 86 04/01/2020 1357   BILITOT 0.6 04/01/2020 1357   No results found for: "CHOL", "HDL", "LDLCALC", "LDLDIRECT", "TRIG", "CHOLHDL" No results found for: "HGBA1C" No results found for: "VITAMINB12" Lab Results  Component Value Date   TSH 3.98 05/28/2020    Butler Denmark, AGNP-C, DNP 02/24/2022, 11:11 AM [facility] [address], [place], [place] [address] [phone]

## 2022-02-24 ENCOUNTER — Ambulatory Visit (INDEPENDENT_AMBULATORY_CARE_PROVIDER_SITE_OTHER): Payer: 59 | Admitting: Neurology

## 2022-02-24 ENCOUNTER — Encounter: Payer: Self-pay | Admitting: Neurology

## 2022-02-24 VITALS — BP 116/64 | HR 66 | Ht 70.0 in | Wt 162.0 lb

## 2022-02-24 DIAGNOSIS — G43009 Migraine without aura, not intractable, without status migrainosus: Secondary | ICD-10-CM

## 2022-02-24 MED ORDER — NURTEC 75 MG PO TBDP: 75.0000 mg | ORAL_TABLET | ORAL | 11 refills | Status: DC

## 2022-02-24 MED ORDER — PROPRANOLOL HCL 20 MG PO TABS: 20.0000 mg | ORAL_TABLET | Freq: Two times a day (BID) | ORAL | 0 refills | Status: DC

## 2022-02-24 NOTE — Patient Instructions (Signed)
Reduce propranolol to 20 mg twice daily for 1 week then stop  Try Nurtec 75 mg every other day for migraine prevention  If any issues, call me is needed  See you back in 4-6 months

## 2022-02-28 ENCOUNTER — Other Ambulatory Visit: Payer: Self-pay | Admitting: Neurology

## 2022-03-02 ENCOUNTER — Telehealth: Payer: Self-pay

## 2022-03-02 NOTE — Telephone Encounter (Signed)
Pa for Nurtec has been sent  (Key: [facility])  [employer] is reviewing your PA request. Typically an electronic response will be received within 24-72 hours. To check for an update later, open this request from your dashboard.  You may close this dialog and return to your dashboard to perform other tasks.

## 2022-03-05 NOTE — Telephone Encounter (Signed)
Nurtec approval received via fax. Approval for up to 8 tables per month, 16 tab a month was denied.  Coverage now until 03/03/2023

## 2022-03-13 ENCOUNTER — Encounter: Payer: Self-pay | Admitting: Neurology

## 2022-03-16 MED ORDER — NURTEC 75 MG PO TBDP: 75.0000 mg | ORAL_TABLET | ORAL | 11 refills | Status: DC

## 2022-03-16 MED ORDER — PROPRANOLOL HCL 20 MG PO TABS: 20.0000 mg | ORAL_TABLET | Freq: Two times a day (BID) | ORAL | 0 refills | Status: DC

## 2022-03-20 ENCOUNTER — Other Ambulatory Visit: Payer: Self-pay | Admitting: Neurology

## 2022-04-29 ENCOUNTER — Encounter: Payer: Self-pay | Admitting: Neurology

## 2022-05-04 MED ORDER — NURTEC 75 MG PO TBDP: 75.0000 mg | ORAL_TABLET | ORAL | 11 refills | Status: AC

## 2022-06-03 NOTE — Addendum Note (Signed)
Addended by: Suzzanne Cloud on: 06/03/2022 08:55 AM   Modules accepted: Orders

## 2022-06-08 MED ORDER — PROPRANOLOL HCL 20 MG PO TABS: 20.0000 mg | ORAL_TABLET | Freq: Two times a day (BID) | ORAL | 5 refills | Status: DC

## 2022-06-08 NOTE — Addendum Note (Signed)
Addended by: Suzzanne Cloud on: 06/08/2022 02:55 PM   Modules accepted: Orders

## 2022-09-01 ENCOUNTER — Ambulatory Visit: Payer: 59 | Admitting: Neurology

## 2023-02-11 ENCOUNTER — Other Ambulatory Visit: Payer: Self-pay

## 2023-02-11 MED ORDER — PROPRANOLOL HCL 20 MG PO TABS: 20.0000 mg | ORAL_TABLET | Freq: Two times a day (BID) | ORAL | 5 refills | Status: AC

## 2024-02-21 ENCOUNTER — Other Ambulatory Visit: Payer: Self-pay
